# Patient Record
Sex: Male | Born: 1970 | Race: Black or African American | Hispanic: No | Marital: Married | Smoking: Current some day smoker
Health system: Southern US, Community
[De-identification: ages and names within clinical notes are randomized; demographics above are authoritative.]

## PROBLEM LIST (undated history)

## (undated) DIAGNOSIS — I10 Essential (primary) hypertension: Secondary | ICD-10-CM

## (undated) DIAGNOSIS — E119 Type 2 diabetes mellitus without complications: Secondary | ICD-10-CM

## (undated) DIAGNOSIS — G4730 Sleep apnea, unspecified: Secondary | ICD-10-CM

## (undated) DIAGNOSIS — J189 Pneumonia, unspecified organism: Secondary | ICD-10-CM

## (undated) DIAGNOSIS — E7800 Pure hypercholesterolemia, unspecified: Secondary | ICD-10-CM

## (undated) DIAGNOSIS — K219 Gastro-esophageal reflux disease without esophagitis: Secondary | ICD-10-CM

## (undated) HISTORY — DX: Type 2 diabetes mellitus without complications: E11.9

---

## 2004-08-20 ENCOUNTER — Ambulatory Visit (HOSPITAL_COMMUNITY): Admission: RE | Admit: 2004-08-20 | Discharge: 2004-08-20 | Payer: Self-pay | Admitting: Specialist

## 2007-01-05 ENCOUNTER — Ambulatory Visit: Payer: Self-pay | Admitting: *Deleted

## 2007-01-05 ENCOUNTER — Ambulatory Visit: Payer: Self-pay | Admitting: Internal Medicine

## 2007-01-05 DIAGNOSIS — J069 Acute upper respiratory infection, unspecified: Secondary | ICD-10-CM | POA: Insufficient documentation

## 2007-01-05 DIAGNOSIS — J020 Streptococcal pharyngitis: Secondary | ICD-10-CM | POA: Insufficient documentation

## 2007-01-05 LAB — CONVERTED CEMR LAB: Streptococcus, Group A Screen (Direct): POSITIVE — AB

## 2011-06-01 ENCOUNTER — Emergency Department (HOSPITAL_COMMUNITY): Payer: PRIVATE HEALTH INSURANCE

## 2011-06-01 ENCOUNTER — Encounter (HOSPITAL_COMMUNITY): Payer: Self-pay | Admitting: Radiology

## 2011-06-01 ENCOUNTER — Emergency Department (HOSPITAL_COMMUNITY)
Admission: EM | Admit: 2011-06-01 | Discharge: 2011-06-01 | Disposition: A | Discharge status: Home / Self Care | Payer: PRIVATE HEALTH INSURANCE | Attending: Emergency Medicine | Admitting: Emergency Medicine

## 2011-06-01 DIAGNOSIS — K219 Gastro-esophageal reflux disease without esophagitis: Secondary | ICD-10-CM | POA: Insufficient documentation

## 2011-06-01 DIAGNOSIS — R0602 Shortness of breath: Secondary | ICD-10-CM | POA: Insufficient documentation

## 2011-06-01 DIAGNOSIS — R079 Chest pain, unspecified: Secondary | ICD-10-CM | POA: Insufficient documentation

## 2011-06-01 DIAGNOSIS — R635 Abnormal weight gain: Secondary | ICD-10-CM | POA: Insufficient documentation

## 2011-06-01 DIAGNOSIS — R000 Tachycardia, unspecified: Secondary | ICD-10-CM | POA: Insufficient documentation

## 2011-06-01 DIAGNOSIS — I10 Essential (primary) hypertension: Secondary | ICD-10-CM | POA: Insufficient documentation

## 2011-06-01 HISTORY — DX: Gastro-esophageal reflux disease without esophagitis: K21.9

## 2011-06-01 LAB — COMPREHENSIVE METABOLIC PANEL
ALT: 48 U/L (ref 0–53)
AST: 25 U/L (ref 0–37)
Alkaline Phosphatase: 89 U/L (ref 39–117)
BUN: 12 mg/dL (ref 6–23)
CO2: 29 mEq/L (ref 19–32)
Calcium: 9.4 mg/dL (ref 8.4–10.5)
Chloride: 101 mEq/L (ref 96–112)
Creatinine, Ser: 1.15 mg/dL (ref 0.50–1.35)
GFR calc Af Amer: >60 mL/min (ref >60)
GFR calc non Af Amer: >60 mL/min (ref >60)
Glucose, Bld: 124 mg/dL — ABNORMAL HIGH (ref 70–99)
Potassium: 3.6 mEq/L (ref 3.5–5.1)
Total Bilirubin: 0.2 mg/dL — ABNORMAL LOW (ref 0.3–1.2)
Total Protein: 7.9 g/dL (ref 6.0–8.3)

## 2011-06-01 LAB — D-DIMER, QUANTITATIVE: D-Dimer, Quant: <0.22 ug/mL-FEU (ref 0.00–0.48)

## 2011-06-01 LAB — POCT I-STAT TROPONIN I: Troponin i, poc: 0 ng/mL (ref 0.00–0.08)

## 2011-06-01 LAB — CBC
HCT: 43.6 % (ref 39.0–52.0)
Hemoglobin: 15.1 g/dL (ref 13.0–17.0)
MCH: 27.2 pg (ref 26.0–34.0)
MCHC: 34.6 g/dL (ref 30.0–36.0)
RBC: 5.55 MIL/uL (ref 4.22–5.81)
RDW: 14.9 % (ref 11.5–15.5)
WBC: 8 10*3/uL (ref 4.0–10.5)

## 2011-06-01 MED ORDER — IOHEXOL 350 MG/ML SOLN
80.0000 mL | Freq: Once | INTRAVENOUS | Status: AC | PRN
  Administered 2011-06-01: 80 mL via INTRAVENOUS

## 2011-06-04 ENCOUNTER — Other Ambulatory Visit (INDEPENDENT_AMBULATORY_CARE_PROVIDER_SITE_OTHER): Payer: PRIVATE HEALTH INSURANCE | Admitting: *Deleted

## 2011-06-04 ENCOUNTER — Other Ambulatory Visit: Payer: Self-pay | Admitting: Internal Medicine

## 2011-06-04 ENCOUNTER — Other Ambulatory Visit: Payer: Self-pay | Admitting: *Deleted

## 2011-06-04 DIAGNOSIS — R079 Chest pain, unspecified: Secondary | ICD-10-CM

## 2011-06-04 DIAGNOSIS — I10 Essential (primary) hypertension: Secondary | ICD-10-CM

## 2011-06-04 LAB — LIPID PANEL
Total CHOL/HDL Ratio: 5
Triglycerides: 102 mg/dL (ref 0.0–149.0)
VLDL: 20.4 mg/dL (ref 0.0–40.0)

## 2011-06-04 LAB — LDL CHOLESTEROL, DIRECT: Direct LDL: 183 mg/dL

## 2011-06-04 LAB — HEMOGLOBIN A1C: Hgb A1c MFr Bld: 6.3 % (ref 4.6–6.5)

## 2011-06-05 LAB — C-REACTIVE PROTEIN: CRP: 0.89 mg/dL — ABNORMAL HIGH (ref <0.60)

## 2011-06-11 ENCOUNTER — Telehealth (HOSPITAL_COMMUNITY): Payer: Self-pay | Admitting: *Deleted

## 2011-06-11 MED ORDER — ATORVASTATIN CALCIUM 40 MG PO TABS: 40.0000 mg | ORAL_TABLET | Freq: Every day | ORAL | Status: DC

## 2011-06-11 NOTE — Telephone Encounter (Signed)
rx sent into pharmacy

## 2011-06-11 NOTE — Telephone Encounter (Signed)
Message copied by Noralee Space on Wed Jun 11, 2011  1:51 PM ------      Message from: Dolores Patty      Created: Tue Jun 10, 2011  2:40 PM       LDL markedly elevated. D/w patient re need for diet and exercise. Start atorva 20qd. (40mg  tabs - cut in half)

## 2011-06-18 NOTE — Consult Note (Signed)
NAMEMAHIN, GUARDIA NO.:  [id number]  MEDICAL RECORD NO.:  [id number]  LOCATION:  [facility]                         FACILITY:  [facility]  PHYSICIAN:  Konnar Ben. Bensimhon, MDDATE OF BIRTH:  May 16, 1971  DATE OF CONSULTATION:  06/01/2011 DATE OF DISCHARGE:                                CONSULTATION   REQUESTING PHYSICIAN:  [facility].  REASON FOR CONSULTATION:  Severe hypertension and chest pain.  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  He denies any history of known cardiac disease.  He also denies a history of hypertension or diabetes.  However, he has not had his blood pressure checked in over a year.  Yesterday, he had a brief episode of chest pain.  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  He found it hard to take a deep breath.  This lasted about 20 minutes.  He finally told one of his colleagues who felt that he was diaphoretic.  His chest pain then resolved spontaneously.  Another colleague came to see him, they checked his blood pressure and the systolic blood pressure was in the 160-170 range with diastolics around 100.  He was then driving home and was talking to his wife and decided to come back to the ER for further evaluation.  On getting to the ER, his blood pressure was 202/110 with a heart rate of 109.  EKG shows sinus tachycardia at 104 with incomplete right bundle- branch block and minimal ST elevation in V1 and V2.  His first set of cardiac markers were negative.  He was treated with three rounds of Lopressor, and his blood pressure is now 135/88, his heart rate is 68. His oxygen saturations are 100% on 2 liters.  REVIEW OF SYSTEMS:  He has a significant history of reflux disease.  He denies any fevers or  chills.  No nausea or vomiting.  His wife says he snores heavily.  He has not had any heart failure symptoms or palpitations.  Remainder of the review of systems, all systems are negative except for HPI and problem list.  PAST MEDICAL HISTORY: 1. Gastroesophageal reflux disease. 2. Tobacco use. 3. Obesity. 4. History of positive PPD status post treatment.  MEDICATIONS:  Prilosec.  ALLERGIES:  CHLOROQUINE.  SOCIAL HISTORY:  He is married.  He has three children.  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  He smokes about half pack a day of cigarettes which he has done for about 10 years.  He drinks alcohol only socially.  He denies any drug use.  FAMILY HISTORY:  His mother had multiple heart attacks in her 30s.  She actually died of complications from liver failure due to aflatoxin. Father had stroke and died in his 33s.  There are multiple family members on his mother's side with coronary artery disease and bypass surgery.  There is no known history of sudden cardiac death at young age.  PHYSICAL EXAMINATION:  GENERAL:  He is currently comfortable, respirations  are unlabored. VITAL SIGNS:  Blood pressure is now 135/85 and his heart rate is 75. HEENT:  Normal. NECK:  Supple and thick and hard to see JVD.  Carotids are 2+ bilaterally.  There is no lymphadenopathy or thyromegaly appreciated. CARDIAC:  PMI is nonpalpable.  He is regular with no obvious murmurs. LUNGS:  Clear. ABDOMEN:  Obese, nontender. EXTREMITIES:  Warm with no cyanosis, clubbing, or edema.  No rash. NEUROLOGIC:  He is alert and oriented x3.  Cranial nerves II through XII are grossly intact.  Moves all four extremities without difficulty.  White count is 8.0, hemoglobin is 15.1, platelets 216.  D-dimer is normal.  The troponin I is 0.  BMET is pending.  EKG shows sinus tachycardia with a rate of 104 with incomplete right bundle-branch block as described above.  There are no acute ST-T  wave abnormalities. Chest x-ray shows cardiomegaly but no airspace disease.  I have performed a bedside echo which showed left ventricular ejection fraction of 60-65%.  There are no regional wall motion abnormalities. The right ventricle looked normal.  ASSESSMENT: 1. Chest pain. 2. Hypertensive crisis. 3. Gastroesophageal reflux disease.  PLAN/DISCUSSION:  I suspect that his chest pain is likely due to hypertensive crisis.  We have treated him with three rounds of Lopressor with good effect.  Given his strong family history, we will proceed with a CT angiogram of his coronary arteries tonight.  I suspect we will be able to let him go home.  He will need tight control of his blood pressure.  I will start him on benazepril and Norvasc combination.  He will follow back up with me to check his lipid status as well as to get a possible sleep study.  I have stressed to him the need to stop smoking and also get into better shape and drop some weight.  We will start him on a baby aspirin as well.     Bevelyn Buckles. Bensimhon, MD     DRB/MEDQ  D:  06/01/2011  T:  06/01/2011  Job:  [id number]  Electronically Signed by Arvilla Meres MD on 06/18/2011 10:38:48 PM

## 2011-07-31 ENCOUNTER — Ambulatory Visit (HOSPITAL_COMMUNITY)
Admission: RE | Admit: 2011-07-31 | Discharge: 2011-07-31 | Disposition: A | Discharge status: Home / Self Care | Payer: PRIVATE HEALTH INSURANCE | Source: Ambulatory Visit | Attending: Internal Medicine | Admitting: Internal Medicine

## 2011-07-31 ENCOUNTER — Encounter (HOSPITAL_COMMUNITY): Payer: Self-pay

## 2011-07-31 VITALS — BP 146/86 | HR 80 | Ht 74.0 in | Wt 270.0 lb

## 2011-07-31 DIAGNOSIS — R0683 Snoring: Secondary | ICD-10-CM

## 2011-07-31 DIAGNOSIS — R079 Chest pain, unspecified: Secondary | ICD-10-CM

## 2011-07-31 DIAGNOSIS — R0989 Other specified symptoms and signs involving the circulatory and respiratory systems: Secondary | ICD-10-CM

## 2011-07-31 DIAGNOSIS — R0609 Other forms of dyspnea: Secondary | ICD-10-CM | POA: Insufficient documentation

## 2011-07-31 DIAGNOSIS — I10 Essential (primary) hypertension: Secondary | ICD-10-CM

## 2011-07-31 DIAGNOSIS — E785 Hyperlipidemia, unspecified: Secondary | ICD-10-CM

## 2011-07-31 LAB — COMPREHENSIVE METABOLIC PANEL
ALT: 66 U/L — ABNORMAL HIGH (ref 0–53)
AST: 37 U/L (ref 0–37)
Albumin: 4.2 g/dL (ref 3.5–5.2)
Alkaline Phosphatase: 118 U/L — ABNORMAL HIGH (ref 39–117)
BUN: 11 mg/dL (ref 6–23)
Calcium: 9.9 mg/dL (ref 8.4–10.5)
Chloride: 101 mEq/L (ref 96–112)
GFR calc non Af Amer: 88 mL/min — ABNORMAL LOW (ref >90)
Glucose, Bld: 103 mg/dL — ABNORMAL HIGH (ref 70–99)
Potassium: 3.6 mEq/L (ref 3.5–5.1)
Sodium: 142 mEq/L (ref 135–145)
Total Bilirubin: 0.4 mg/dL (ref 0.3–1.2)
Total Protein: 8 g/dL (ref 6.0–8.3)

## 2011-07-31 LAB — LIPID PANEL
Cholesterol: 169 mg/dL (ref 0–200)
HDL: 53 mg/dL (ref >39)
Total CHOL/HDL Ratio: 3.2 RATIO
Triglycerides: 84 mg/dL (ref <150)
VLDL: 17 mg/dL (ref 0–40)

## 2011-07-31 NOTE — Progress Notes (Signed)
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 Several months ago he had episode of CP and underwent cardiac CT which showed normal coronaries. Stated on Lotrel and atorvastatin.  Here for f/u.  Doing very well. Being more active. Walking several days a week on his weeks off for an hour at a time. Checking his BP occasionally SBP 130-140. No problem with medicines. Taking Lipitor 40 every other day. Changed his diet to lower fat foods and no salt but hasn't lost much weight.  Snores a lot. + daytime fatigue.   ROS: All systems negative except as listed in HPI, PMH and Problem List.  Past Medical History  Diagnosis Date  . GERD (gastroesophageal reflux disease)     Current Outpatient Prescriptions  Medication Sig Dispense Refill  . amLODipine-benazepril (LOTREL) 10-20 MG per capsule Take 1 capsule by mouth daily.        Marland Kitchen atorvastatin (LIPITOR) 40 MG tablet Take 20 mg by mouth daily.        Marland Kitchen omeprazole (PRILOSEC) 20 MG capsule Take 40 mg by mouth daily.           PHYSICAL EXAM: Filed Vitals:   07/31/11 0942  BP: 146/86  Pulse: 80   Weight change:  General:  Well appearing. No resp difficulty HEENT: normal Neck: supple. JVP flat. Carotids 2+ bilaterally; no bruits. No lymphadenopathy or thryomegaly appreciated. Cor: PMI normal. Regular rate & rhythm. No rubs, gallops or murmurs. Lungs: clear Abdomen: soft, obese, nontender, nondistended. No hepatosplenomegaly.  Extremities: no cyanosis, clubbing, rash, edema Neuro: alert & orientedx3, cranial nerves grossly intact. Moves all 4 extremities w/o difficulty. Affect pleasant.    ECG:   ASSESSMENT & PLAN:

## 2011-08-01 DIAGNOSIS — E785 Hyperlipidemia, unspecified: Secondary | ICD-10-CM | POA: Insufficient documentation

## 2011-08-01 DIAGNOSIS — R0683 Snoring: Secondary | ICD-10-CM | POA: Insufficient documentation

## 2011-08-01 DIAGNOSIS — I10 Essential (primary) hypertension: Secondary | ICD-10-CM | POA: Insufficient documentation

## 2011-08-01 DIAGNOSIS — R079 Chest pain, unspecified: Secondary | ICD-10-CM | POA: Insufficient documentation

## 2011-08-01 NOTE — Assessment & Plan Note (Addendum)
Continue statin. Due for lipids recheck. Continue exercise and diet modification.

## 2011-08-01 NOTE — Assessment & Plan Note (Signed)
Resolved. Cardiac CT without any CAD. Needs aggressive RF management.

## 2011-08-01 NOTE — Assessment & Plan Note (Signed)
He almost certainly has OSA. Discussed going for sleep study. Would like a chance to lose weight and treat with weight loss rather than CPAP. Will give him 3-4 month trial to see how he does. If weight still up on return get sleep study.

## 2011-08-01 NOTE — Assessment & Plan Note (Signed)
BP improved but still elevated. Increase Lotrel 40/20.

## 2011-12-04 ENCOUNTER — Encounter (HOSPITAL_COMMUNITY): Payer: Self-pay

## 2011-12-04 ENCOUNTER — Ambulatory Visit (HOSPITAL_COMMUNITY)
Admission: RE | Admit: 2011-12-04 | Discharge: 2011-12-04 | Disposition: A | Discharge status: Home / Self Care | Payer: PRIVATE HEALTH INSURANCE | Source: Ambulatory Visit | Attending: Internal Medicine | Admitting: Internal Medicine

## 2011-12-04 ENCOUNTER — Encounter (HOSPITAL_COMMUNITY): Payer: PRIVATE HEALTH INSURANCE

## 2011-12-04 VITALS — BP 120/76 | HR 85 | Wt 260.5 lb

## 2011-12-04 DIAGNOSIS — I10 Essential (primary) hypertension: Secondary | ICD-10-CM | POA: Insufficient documentation

## 2011-12-04 DIAGNOSIS — R0683 Snoring: Secondary | ICD-10-CM

## 2011-12-04 DIAGNOSIS — R0609 Other forms of dyspnea: Secondary | ICD-10-CM | POA: Insufficient documentation

## 2011-12-04 DIAGNOSIS — E669 Obesity, unspecified: Secondary | ICD-10-CM | POA: Insufficient documentation

## 2011-12-04 DIAGNOSIS — K219 Gastro-esophageal reflux disease without esophagitis: Secondary | ICD-10-CM | POA: Insufficient documentation

## 2011-12-04 DIAGNOSIS — R0989 Other specified symptoms and signs involving the circulatory and respiratory systems: Secondary | ICD-10-CM | POA: Insufficient documentation

## 2011-12-04 DIAGNOSIS — F17200 Nicotine dependence, unspecified, uncomplicated: Secondary | ICD-10-CM

## 2011-12-04 DIAGNOSIS — E785 Hyperlipidemia, unspecified: Secondary | ICD-10-CM

## 2011-12-04 DIAGNOSIS — Z79899 Other long term (current) drug therapy: Secondary | ICD-10-CM | POA: Insufficient documentation

## 2011-12-04 DIAGNOSIS — Z720 Tobacco use: Secondary | ICD-10-CM

## 2011-12-04 NOTE — Progress Notes (Signed)
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 Several months ago he had episode of CP and underwent cardiac CT which showed normal coronaries. Stated on Lotrel and atorvastatin.  Here for f/u.  Doing very well. Being more active. Walking several days a week on his weeks off for an hour at a time. Checking his BP occasionally SBP 120-135. No problem with medicines. Taking Lipitor 40 every other day. Changed his diet to lower fat foods and lost about 10 pounds.  Still smoking a couple cigarettes here and there.   Lipids rechecked in 11/12:  (LDL down from 183)  Lab Results  Component Value Date   CHOL 169 07/31/2011   HDL 53 07/31/2011   LDLCALC 99 07/31/2011   LDLDIRECT 183.0 06/04/2011   TRIG 84 07/31/2011   CHOLHDL 3.2 07/31/2011      Snores a lot. + daytime fatigue.   ROS: All systems negative except as listed in HPI, PMH and Problem List.  Past Medical History  Diagnosis Date  . GERD (gastroesophageal reflux disease)     Current Outpatient Prescriptions  Medication Sig Dispense Refill  . amLODipine-benazepril (LOTREL) 10-40 MG per capsule Take 1 capsule by mouth daily.      Marland Kitchen atorvastatin (LIPITOR) 40 MG tablet Take 20 mg by mouth daily.        Marland Kitchen omeprazole (PRILOSEC) 20 MG capsule Take 40 mg by mouth daily.           PHYSICAL EXAM: Filed Vitals:   12/04/11 1051  BP: 120/76  Pulse: 85   Weight change:  General:  Well appearing. No resp difficulty HEENT: normal Neck: supple. JVP flat. Carotids 2+ bilaterally; no bruits. No lymphadenopathy or thryomegaly appreciated. Cor: PMI normal. Regular rate & rhythm. No rubs, gallops or murmurs. Lungs: clear Abdomen: soft, obese, nontender, nondistended. No hepatosplenomegaly.  Extremities: no cyanosis, clubbing, rash, edema Neuro: alert & orientedx3, cranial nerves grossly intact. Moves all 4 extremities w/o difficulty. Affect pleasant.    ASSESSMENT & PLAN:

## 2011-12-04 NOTE — Patient Instructions (Signed)
Cmet, lipids and hgb A1C in 2 months.  We will contact you in 6 months to schedule your next appointment.

## 2011-12-07 DIAGNOSIS — Z720 Tobacco use: Secondary | ICD-10-CM | POA: Insufficient documentation

## 2011-12-07 NOTE — Assessment & Plan Note (Addendum)
Encouraged him to stop smoking completely.

## 2011-12-07 NOTE — Assessment & Plan Note (Signed)
Much improved with atorva. Will continue.

## 2011-12-07 NOTE — Assessment & Plan Note (Signed)
Much improved. Continue current regimen. Encouraged him to continue with his diet and exercise and weight loss efforts.

## 2011-12-07 NOTE — Assessment & Plan Note (Signed)
He would like to defer sleep study at this time and continue to try to improve with weight loss. As he does not have significant fatigue and bP is well controlled, I think this is reasonable.

## 2011-12-18 ENCOUNTER — Encounter (HOSPITAL_COMMUNITY): Payer: PRIVATE HEALTH INSURANCE

## 2012-07-20 ENCOUNTER — Other Ambulatory Visit (HOSPITAL_COMMUNITY): Payer: Self-pay | Admitting: *Deleted

## 2012-07-20 MED ORDER — ATORVASTATIN CALCIUM 40 MG PO TABS: 20.0000 mg | ORAL_TABLET | Freq: Every day | ORAL | Status: DC

## 2012-12-02 ENCOUNTER — Other Ambulatory Visit (HOSPITAL_COMMUNITY): Payer: Self-pay | Admitting: *Deleted

## 2012-12-02 MED ORDER — ATORVASTATIN CALCIUM 40 MG PO TABS: 20.0000 mg | ORAL_TABLET | Freq: Every day | ORAL | Status: DC

## 2012-12-02 MED ORDER — AMLODIPINE BESY-BENAZEPRIL HCL 10-40 MG PO CAPS: 1.0000 | ORAL_CAPSULE | Freq: Every day | ORAL | Status: DC

## 2013-01-03 ENCOUNTER — Telehealth (HOSPITAL_COMMUNITY): Payer: Self-pay | Admitting: *Deleted

## 2013-01-03 DIAGNOSIS — I10 Essential (primary) hypertension: Secondary | ICD-10-CM

## 2013-01-03 MED ORDER — POTASSIUM CHLORIDE CRYS ER 20 MEQ PO TBCR: 20.0000 meq | EXTENDED_RELEASE_TABLET | Freq: Every day | ORAL | Status: DC

## 2013-01-03 NOTE — Telephone Encounter (Signed)
Per Dr Gala Romney pt needs KCL 20 meq daily, pt aware and prescription sent into pharmacy, he will get bmet next week at [facility]

## 2013-01-12 ENCOUNTER — Other Ambulatory Visit (INDEPENDENT_AMBULATORY_CARE_PROVIDER_SITE_OTHER): Payer: PRIVATE HEALTH INSURANCE

## 2013-01-12 DIAGNOSIS — I10 Essential (primary) hypertension: Secondary | ICD-10-CM

## 2013-01-12 LAB — BASIC METABOLIC PANEL
BUN: 14 mg/dL (ref 6–23)
CO2: 28 mEq/L (ref 19–32)
Calcium: 9.1 mg/dL (ref 8.4–10.5)
GFR: 99.19 mL/min (ref >60.00)
Potassium: 4 mEq/L (ref 3.5–5.1)
Sodium: 139 mEq/L (ref 135–145)

## 2013-01-25 ENCOUNTER — Encounter (HOSPITAL_COMMUNITY): Payer: Self-pay | Admitting: *Deleted

## 2013-06-22 ENCOUNTER — Telehealth (HOSPITAL_COMMUNITY): Payer: Self-pay | Admitting: *Deleted

## 2013-06-22 MED ORDER — ROSUVASTATIN CALCIUM 10 MG PO TABS: 10.0000 mg | ORAL_TABLET | Freq: Every day | ORAL | Status: DC

## 2013-06-22 NOTE — Telephone Encounter (Signed)
Pt called and c/o myalgia with Lipitor, per Dr Gala Romney change to Crestor 10 mg daily pt aware, rx sent in pt will call back to sch appt and labs

## 2013-11-25 ENCOUNTER — Other Ambulatory Visit (HOSPITAL_COMMUNITY): Payer: Self-pay | Admitting: *Deleted

## 2013-11-25 MED ORDER — AMLODIPINE BESY-BENAZEPRIL HCL 10-40 MG PO CAPS: 1.0000 | ORAL_CAPSULE | Freq: Every day | ORAL | Status: DC

## 2015-03-02 ENCOUNTER — Other Ambulatory Visit (HOSPITAL_COMMUNITY): Payer: Self-pay | Admitting: *Deleted

## 2015-03-02 MED ORDER — AMLODIPINE BESY-BENAZEPRIL HCL 10-40 MG PO CAPS: 1.0000 | ORAL_CAPSULE | Freq: Every day | ORAL | Status: DC

## 2015-03-02 MED ORDER — ROSUVASTATIN CALCIUM 10 MG PO TABS: 10.0000 mg | ORAL_TABLET | Freq: Every day | ORAL | Status: DC

## 2015-04-18 ENCOUNTER — Encounter (HOSPITAL_COMMUNITY): Payer: Self-pay

## 2015-04-18 ENCOUNTER — Ambulatory Visit (HOSPITAL_COMMUNITY)
Admission: RE | Admit: 2015-04-18 | Discharge: 2015-04-18 | Disposition: A | Discharge status: Home / Self Care | Payer: PRIVATE HEALTH INSURANCE | Source: Ambulatory Visit | Attending: Internal Medicine | Admitting: Internal Medicine

## 2015-04-18 VITALS — BP 132/84 | HR 87 | Wt 281.5 lb

## 2015-04-18 DIAGNOSIS — R0683 Snoring: Secondary | ICD-10-CM

## 2015-04-18 DIAGNOSIS — I10 Essential (primary) hypertension: Secondary | ICD-10-CM | POA: Insufficient documentation

## 2015-04-18 DIAGNOSIS — Z720 Tobacco use: Secondary | ICD-10-CM | POA: Insufficient documentation

## 2015-04-18 DIAGNOSIS — E785 Hyperlipidemia, unspecified: Secondary | ICD-10-CM | POA: Insufficient documentation

## 2015-04-18 DIAGNOSIS — E669 Obesity, unspecified: Secondary | ICD-10-CM | POA: Diagnosis not present

## 2015-04-18 LAB — LIPID PANEL
CHOL/HDL RATIO: 4.5 ratio
CHOLESTEROL: 212 mg/dL — AB (ref 0–200)
HDL: 47 mg/dL (ref >40)
LDL CALC: 150 mg/dL — AB (ref 0–99)
TRIGLYCERIDES: 76 mg/dL (ref <150)
VLDL: 15 mg/dL (ref 0–40)

## 2015-04-18 LAB — COMPREHENSIVE METABOLIC PANEL
ALT: 44 U/L (ref 17–63)
ANION GAP: 9 (ref 5–15)
AST: 26 U/L (ref 15–41)
Albumin: 4.1 g/dL (ref 3.5–5.0)
Alkaline Phosphatase: 76 U/L (ref 38–126)
BILIRUBIN TOTAL: 0.7 mg/dL (ref 0.3–1.2)
BUN: 10 mg/dL (ref 6–20)
CALCIUM: 9.3 mg/dL (ref 8.9–10.3)
CO2: 26 mmol/L (ref 22–32)
Chloride: 103 mmol/L (ref 101–111)
Creatinine, Ser: 1.03 mg/dL (ref 0.61–1.24)
GFR calc Af Amer: >60 mL/min (ref >60)
GFR calc non Af Amer: >60 mL/min (ref >60)
GLUCOSE: 99 mg/dL (ref 65–99)
POTASSIUM: 3.7 mmol/L (ref 3.5–5.1)
Sodium: 138 mmol/L (ref 135–145)
Total Protein: 7.8 g/dL (ref 6.5–8.1)

## 2015-04-18 LAB — TSH: TSH: 0.711 u[IU]/mL (ref 0.350–4.500)

## 2015-04-18 LAB — CBC
HCT: 46.7 % (ref 39.0–52.0)
HEMOGLOBIN: 16 g/dL (ref 13.0–17.0)
MCH: 26.8 pg (ref 26.0–34.0)
MCHC: 34.3 g/dL (ref 30.0–36.0)
MCV: 78.1 fL (ref 78.0–100.0)
Platelets: 202 10*3/uL (ref 150–400)
RBC: 5.98 MIL/uL — ABNORMAL HIGH (ref 4.22–5.81)
RDW: 14.7 % (ref 11.5–15.5)
WBC: 5.6 10*3/uL (ref 4.0–10.5)

## 2015-04-18 NOTE — Addendum Note (Signed)
Encounter addended by: Effie Berkshire, RN on: 04/18/2015 11:00 AM<BR>     Documentation filed: Orders, Dx Association, Patient Instructions Section

## 2015-04-18 NOTE — Progress Notes (Signed)
Patient ID: Mark Nichols, male   DOB: 12/11/1970, 44 y.o.   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 Underwent cardiac CT which showed normal coronaries in 9/12. Stated on Lotrel and atorvastatin. Could not tolerate atorva due to myalgias. Now on crestor 20 qod.   Here for f/u.  Overall doing well. Continues to struggle with severe myalgias. SBP stable in 130s. Compliant with med. Has cut back on smoking now 1/2 ppd. Snores a lot. Has never had a sleep study. Walking for exercise on occasion. Has gained 20 pounds.   Lipids rechecked in 11/12:  (LDL down from 183)  Lab Results  Component Value Date   CHOL 169 07/31/2011   HDL 53 07/31/2011   LDLCALC 99 07/31/2011   LDLDIRECT 183.0 06/04/2011   TRIG 84 07/31/2011   CHOLHDL 3.2 07/31/2011    ROS: All systems negative except as listed in HPI, PMH and Problem List.  Past Medical History  Diagnosis Date  . GERD (gastroesophageal reflux disease)     Current Outpatient Prescriptions  Medication Sig Dispense Refill  . amLODipine-benazepril (LOTREL) 10-40 MG per capsule Take 1 capsule by mouth daily. 90 capsule 4  . omeprazole (PRILOSEC) 20 MG capsule Take 40 mg by mouth daily.      . rosuvastatin (CRESTOR) 10 MG tablet Take 1 tablet (10 mg total) by mouth daily. 90 tablet 3   No current facility-administered medications for this encounter.     PHYSICAL EXAM: Filed Vitals:   04/18/15 1018  BP: 132/84  Pulse: 87   General:  Well appearing. No resp difficulty HEENT: normal Neck: supple. JVP flat. Carotids 2+ bilaterally; no bruits. No lymphadenopathy or thryomegaly appreciated. Cor: PMI normal. Regular rate & rhythm. No rubs, gallops or murmurs. Lungs: clear Abdomen: soft, obese, nontender, nondistended. No hepatosplenomegaly.  Extremities: no cyanosis, clubbing, rash, edema Neuro: alert & orientedx3, cranial nerves grossly intact. Moves all 4 extremities w/o  difficulty. Affect pleasant.  NSR 66 No ST-T wave abnormalities.    ASSESSMENT & PLAN: 1. HTN 2. Hyperlipidemia  --c/b statin induced myalgias 3. Probable OSA 4. Tobacco use 5. Obesity   Will continue current medications for now. Wil try coQ10 200mg  daily to see if it will help with myalgias. If not can consider switch to pravastatin +/- zetia. Will refer for sleep study. Ideally would like SBP < 130. Hopefull will improve with treatment of OSA. Needs to stop smoking and lose weight. Check labs today including: CBC, CMET, lipid panel, hgba1c, TSH.   Bensimhon, Kalub,MD 10:54 AM

## 2015-04-18 NOTE — Patient Instructions (Signed)
Routine lab work today. Will notify you of abnormal results, otherwise no news is good news!  Follow up 12 months.  Do the following things EVERYDAY: 1) Weigh yourself in the morning before breakfast. Write it down and keep it in a log. 2) Take your medicines as prescribed 3) Eat low salt foods-Limit salt (sodium) to 2000 mg per day.  4) Stay as active as you can everyday 5) Limit all fluids for the day to less than 2 liters

## 2015-04-25 ENCOUNTER — Other Ambulatory Visit (HOSPITAL_COMMUNITY): Payer: Self-pay | Admitting: *Deleted

## 2015-04-25 MED ORDER — EZETIMIBE 10 MG PO TABS: 10.0000 mg | ORAL_TABLET | Freq: Every day | ORAL | Status: DC

## 2015-04-25 MED ORDER — OMEPRAZOLE 20 MG PO CPDR: 40.0000 mg | DELAYED_RELEASE_CAPSULE | Freq: Every day | ORAL | Status: DC

## 2015-05-14 ENCOUNTER — Telehealth: Payer: Self-pay | Admitting: Pulmonary Disease

## 2015-05-14 DIAGNOSIS — G4733 Obstructive sleep apnea (adult) (pediatric): Secondary | ICD-10-CM

## 2015-05-14 NOTE — Telephone Encounter (Signed)
Order entered for HST Patient notified. Will schedule appointment when resulted. Nothing further needed.

## 2015-05-14 NOTE — Telephone Encounter (Signed)
Request from dr bensimhon - pl order home sleep test Make appt for [facility] office after study to discuss results

## 2016-02-21 DIAGNOSIS — B0052 Herpesviral keratitis: Secondary | ICD-10-CM | POA: Diagnosis not present

## 2016-05-27 ENCOUNTER — Other Ambulatory Visit (HOSPITAL_COMMUNITY): Payer: Self-pay | Admitting: *Deleted

## 2016-05-27 MED ORDER — EZETIMIBE 10 MG PO TABS: 10.0000 mg | ORAL_TABLET | Freq: Every day | ORAL | 3 refills | Status: DC

## 2016-05-27 MED ORDER — AMLODIPINE BESY-BENAZEPRIL HCL 10-40 MG PO CAPS: 1.0000 | ORAL_CAPSULE | Freq: Every day | ORAL | 3 refills | Status: DC

## 2016-05-27 MED ORDER — OMEPRAZOLE 20 MG PO CPDR: 40.0000 mg | DELAYED_RELEASE_CAPSULE | Freq: Every day | ORAL | 3 refills | Status: DC

## 2016-05-27 MED ORDER — ROSUVASTATIN CALCIUM 10 MG PO TABS: 10.0000 mg | ORAL_TABLET | Freq: Every day | ORAL | 3 refills | Status: DC

## 2016-07-02 MED FILL — VENTOLIN HFA 90 MCG INHALER: 108 (90 BAS | 25 days supply | Qty: 18 | Fill #0

## 2016-07-02 MED FILL — AZITHROMYCIN 500 MG TABLET: 500 | 5 days supply | Qty: 5 | Fill #0

## 2016-10-27 MED FILL — OSELTAMIVIR PHOS 75 MG CAP: 75 | 5 days supply | Qty: 10 | Fill #0

## 2017-05-26 ENCOUNTER — Other Ambulatory Visit (HOSPITAL_COMMUNITY): Payer: Self-pay | Admitting: *Deleted

## 2017-05-26 MED ORDER — AMLODIPINE BESY-BENAZEPRIL HCL 10-40 MG PO CAPS: 1.0000 | ORAL_CAPSULE | Freq: Every day | ORAL | 3 refills | Status: DC

## 2017-05-26 MED ORDER — OMEPRAZOLE 20 MG PO CPDR: 40.0000 mg | DELAYED_RELEASE_CAPSULE | Freq: Every day | ORAL | 0 refills | Status: DC

## 2017-05-26 MED FILL — AMLODIPINE-BENAZEPRIL 10-40: 10-40 | 90 days supply | Qty: 90 | Fill #0

## 2017-05-26 MED FILL — OMEPRAZOLE 20 MG CAP: 20 | 90 days supply | Qty: 180 | Fill #0

## 2017-08-06 DIAGNOSIS — H11823 Conjunctivochalasis, bilateral: Secondary | ICD-10-CM | POA: Diagnosis not present

## 2017-08-06 DIAGNOSIS — Z8619 Personal history of other infectious and parasitic diseases: Secondary | ICD-10-CM | POA: Diagnosis not present

## 2017-08-06 DIAGNOSIS — H11153 Pinguecula, bilateral: Secondary | ICD-10-CM | POA: Diagnosis not present

## 2017-08-06 DIAGNOSIS — H2513 Age-related nuclear cataract, bilateral: Secondary | ICD-10-CM | POA: Diagnosis not present

## 2017-08-18 ENCOUNTER — Other Ambulatory Visit (HOSPITAL_COMMUNITY): Payer: Self-pay | Admitting: Internal Medicine

## 2017-08-18 MED FILL — AMLODIPINE-BENAZ 10-40 MG: 10-40 | 90 days supply | Qty: 90 | Fill #1

## 2017-11-10 MED FILL — AMLODIPINE-BENAZ 10-40 MG: 10-40 | 90 days supply | Qty: 90 | Fill #2

## 2017-11-26 ENCOUNTER — Encounter: Payer: Self-pay | Admitting: Pulmonary Disease

## 2017-11-26 ENCOUNTER — Ambulatory Visit: Payer: 59 | Admitting: Pulmonary Disease

## 2017-11-26 DIAGNOSIS — G4733 Obstructive sleep apnea (adult) (pediatric): Secondary | ICD-10-CM | POA: Diagnosis not present

## 2017-11-26 NOTE — Patient Instructions (Signed)
Home sleep study 

## 2017-11-26 NOTE — Assessment & Plan Note (Signed)
Given excessive daytime somnolence, narrow pharyngeal exam, witnessed apneas & loud snoring, obstructive sleep apnea is very likely & an overnight polysomnogram will be scheduled as a home study. The pathophysiology of obstructive sleep apnea , it's cardiovascular consequences & modes of treatment including CPAP were discused with the patient in detail & they evidenced understanding.  Pretest probability is high 

## 2017-11-26 NOTE — Assessment & Plan Note (Signed)
Weight loss recommended, at least 10% of body weight about 30 pounds for him over the next year

## 2017-11-26 NOTE — Progress Notes (Signed)
Subjective:    Patient ID: Mark Nichols, male    DOB: Aug 15, 1971, 47 y.o.   MRN: [id number]  HPI  Chief Complaint  Patient presents with  . Sleep Consult    Referral for eval for OSA. Denies ever having a sleeps study before.      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 Loud snoring has been witnessed by family members and his wife is also witnessed apneas. Epworth sleepiness score is 20 and he reports sleepiness while sitting and reading, watching TV, sitting inactive in a public place or as a passenger in a car.  He sometimes has to drive back and forth from [facility] to [place] and does admit to falling sleepy sometimes.  He had an episode of hypertensive urgency seeing a cardiologist. His neck circumference is 19.5 inches and his weight is about 291 pounds  Bedtime is between 11 PM to 1 AM, his work schedule is only 1 week on the next week off, sleep latency is a few minutes, he reports significant acid reflux for which she sometimes sleeps on 3 pillows, prefers to sleep on his side, reports several nocturnal awakenings spontaneously with some nocturia and is out of bed by 6 AM on workdays and around 645 on his days off feeling tired with occasional dryness of mouth.  He denies morning headaches.  He has gained about 70-80 pounds in the last 10 years.  There is no history suggestive of cataplexy, sleep paralysis or parasomnias   Past medical history of hypertension hypercholesterolemia Medications were reviewed.      Past Medical History:  Diagnosis Date  . GERD (gastroesophageal reflux disease)    Allergies  Allergen Reactions  . Chloroquine     Social History   Socioeconomic History  . Marital status: Married    Spouse name: Not on file  . Number of children: Not on file  . Years of education: Not on file  . Highest education level: Not on file  Social Needs  . Financial resource strain:  Not on file  . Food insecurity - worry: Not on file  . Food insecurity - inability: Not on file  . Transportation needs - medical: Not on file  . Transportation needs - non-medical: Not on file  Occupational History  . Not on file  Tobacco Use  . Smoking status: Current Some Day Smoker  . Smokeless tobacco: Never Used  Substance and Sexual Activity  . Alcohol use: Yes    Comment: occasionally  . Drug use: No  . Sexual activity: Not on file  Other Topics Concern  . Not on file  Social History Narrative  . Not on file      History reviewed. No pertinent family history.    Review of Systems  Positive for sleepiness as above, acid heartburn and nasal congestion  Constitutional: negative for anorexia, fevers and sweats  Eyes: negative for irritation, redness and visual disturbance  Ears, nose, mouth, throat, and face: negative for earaches, epistaxis, nasal congestion and sore throat  Respiratory: negative for cough, dyspnea on exertion, sputum and wheezing  Cardiovascular: negative for chest pain, dyspnea, lower extremity edema, orthopnea, palpitations and syncope  Gastrointestinal: negative for abdominal pain, constipation, diarrhea, melena, nausea and vomiting  Genitourinary:negative for dysuria, frequency and hematuria  Hematologic/lymphatic: negative for bleeding, easy bruising and lymphadenopathy  Musculoskeletal:negative for arthralgias, muscle weakness and stiff joints  Neurological: negative for coordination problems, gait problems, headaches and weakness  Endocrine: negative for diabetic symptoms including polydipsia, polyuria and weight loss     Objective:   Physical Exam  Gen. Pleasant, obese, in no distress, normal affect ENT - large uvula, no post nasal drip, class 3 airway , swollen nasal turbinates Neck: No JVD, no thyromegaly, no carotid bruits, circ 19.5 " Lungs: no use of accessory muscles, no dullness to percussion, decreased without rales or rhonchi    Cardiovascular: Rhythm regular, heart sounds  normal, no murmurs or gallops, no peripheral edema Abdomen: soft and non-tender, no hepatosplenomegaly, BS normal. Musculoskeletal: No deformities, no cyanosis or clubbing Neuro:  alert, non focal, no tremors       Assessment & Plan:

## 2017-11-30 DIAGNOSIS — G4733 Obstructive sleep apnea (adult) (pediatric): Secondary | ICD-10-CM | POA: Diagnosis not present

## 2017-12-01 ENCOUNTER — Telehealth: Payer: Self-pay | Admitting: Pulmonary Disease

## 2017-12-01 DIAGNOSIS — G4733 Obstructive sleep apnea (adult) (pediatric): Secondary | ICD-10-CM

## 2017-12-01 NOTE — Telephone Encounter (Signed)
Per RA, HST showed severe OSA. Recommends a RX for auto cpap 5-15cm, mask of choice, humidity and OV in 6 weeks.   RA spoke with patient personally, patient wishes to proceed with CPAP order. Will go ahead and place this today.   Nothing else needed at time of call.

## 2017-12-02 ENCOUNTER — Other Ambulatory Visit: Payer: Self-pay | Admitting: *Deleted

## 2017-12-02 DIAGNOSIS — G4733 Obstructive sleep apnea (adult) (pediatric): Secondary | ICD-10-CM

## 2017-12-29 DIAGNOSIS — G4733 Obstructive sleep apnea (adult) (pediatric): Secondary | ICD-10-CM | POA: Diagnosis not present

## 2018-01-28 DIAGNOSIS — G4733 Obstructive sleep apnea (adult) (pediatric): Secondary | ICD-10-CM | POA: Diagnosis not present

## 2018-02-04 ENCOUNTER — Telehealth: Payer: Self-pay | Admitting: Pulmonary Disease

## 2018-02-04 NOTE — Telephone Encounter (Signed)
Spoke with pt, he called to make an appt with RA. Made appt on 02/09/2018 at 10:00am. He states he received his CPAP around 12/28/2017. Nothing further is needed.

## 2018-02-09 ENCOUNTER — Encounter: Payer: Self-pay | Admitting: Pulmonary Disease

## 2018-02-09 ENCOUNTER — Ambulatory Visit: Payer: 59 | Admitting: Pulmonary Disease

## 2018-02-09 DIAGNOSIS — G4733 Obstructive sleep apnea (adult) (pediatric): Secondary | ICD-10-CM | POA: Diagnosis not present

## 2018-02-09 MED FILL — AMLODIPINE-BENAZ 10-40 MG: 10-40 | 90 days supply | Qty: 90 | Fill #3

## 2018-02-09 NOTE — Assessment & Plan Note (Signed)
Seems to require average of 14 cm. Auto settings seem to be working well for him. He has settled down with nasal mask and this seems to work well. He needs to extend his sleep time about 6 hours every night  Weight loss encouraged, compliance with goal of at least 4-6 hrs every night is the expectation. Advised against medications with sedative side effects Cautioned against driving when sleepy - understanding that sleepiness will vary on a day to day basis

## 2018-02-09 NOTE — Progress Notes (Signed)
   Subjective:    Patient ID: Mark Nichols, male    DOB: Sep 21, 1971, 47 y.o.   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  He slept on his left side for about an hour and this also showed severe OSA. He was started on auto CPAP therapy, he is settled on nasal mask, he tried nasal pillows initially. He noticed improvement in his daytime somnolence fatigue, life as noted that snoring is been abolished. Download was reviewed which shows excellent control of events, AHI about 3/hour on average pressure of 14 cm with minimal leak. Compliance is acceptable more than 4.5 hours on average, with few nights less than 4 hours. His sleep pattern is erratic especially in the weeks that he works and he gets about 4 hours of sleep on his work nights  No problems with mask or pressure, no problems with dryness  Significant tests/ events reviewed  HST 11/2017  Severe, AHI 77/h  Review of Systems Patient denies significant dyspnea,cough, hemoptysis,  chest pain, palpitations, pedal edema, orthopnea, paroxysmal nocturnal dyspnea, lightheadedness, nausea, vomiting, abdominal or  leg pains      Objective:   Physical Exam  Gen. Pleasant, obese, in no distress ENT - no lesions, no post nasal drip Neck: No JVD, no thyromegaly, no carotid bruits Lungs: no use of accessory muscles, no dullness to percussion, decreased without rales or rhonchi  Cardiovascular: Rhythm regular, heart sounds  normal, no murmurs or gallops, no peripheral edema Musculoskeletal: No deformities, no cyanosis or clubbing , no tremors       Assessment & Plan:

## 2018-02-09 NOTE — Patient Instructions (Signed)
You are on auto CPAP settings with average pressure of 14 cm. CPAP is working well. Try to get at least 6 hours of sleep every night  CPAP supplies will be renewed for a year

## 2018-02-28 DIAGNOSIS — G4733 Obstructive sleep apnea (adult) (pediatric): Secondary | ICD-10-CM | POA: Diagnosis not present

## 2018-03-02 DIAGNOSIS — G4733 Obstructive sleep apnea (adult) (pediatric): Secondary | ICD-10-CM | POA: Diagnosis not present

## 2018-03-09 DIAGNOSIS — B0052 Herpesviral keratitis: Secondary | ICD-10-CM | POA: Diagnosis not present

## 2018-03-30 DIAGNOSIS — G4733 Obstructive sleep apnea (adult) (pediatric): Secondary | ICD-10-CM | POA: Diagnosis not present

## 2018-04-05 DIAGNOSIS — G4733 Obstructive sleep apnea (adult) (pediatric): Secondary | ICD-10-CM | POA: Diagnosis not present

## 2018-04-16 ENCOUNTER — Telehealth: Payer: Self-pay | Admitting: Pulmonary Disease

## 2018-04-16 NOTE — Telephone Encounter (Signed)
Called [facility] and spoke with Manuela Schwartz. She is needing OV notes from visit on 02/09/18. Verified faxed number and sent notes over. Nothing further needed.

## 2018-04-30 DIAGNOSIS — G4733 Obstructive sleep apnea (adult) (pediatric): Secondary | ICD-10-CM | POA: Diagnosis not present

## 2018-05-06 ENCOUNTER — Other Ambulatory Visit (HOSPITAL_COMMUNITY): Payer: Self-pay | Admitting: *Deleted

## 2018-05-06 MED ORDER — AMLODIPINE BESY-BENAZEPRIL HCL 10-40 MG PO CAPS: 1.0000 | ORAL_CAPSULE | Freq: Every day | ORAL | 6 refills | Status: DC

## 2018-05-06 MED ORDER — AMLODIPINE BESY-BENAZEPRIL HCL 10-40 MG PO CAPS: 1.0000 | ORAL_CAPSULE | Freq: Every day | ORAL | 2 refills | Status: DC

## 2018-05-06 MED ORDER — OMEPRAZOLE MAGNESIUM 20 MG PO TBEC: 20.0000 mg | DELAYED_RELEASE_TABLET | Freq: Every day | ORAL | 6 refills | Status: DC

## 2018-05-06 MED ORDER — OMEPRAZOLE MAGNESIUM 20 MG PO TBEC: 20.0000 mg | DELAYED_RELEASE_TABLET | Freq: Every day | ORAL | 2 refills | Status: DC

## 2018-05-06 MED ORDER — ROSUVASTATIN CALCIUM 10 MG PO TABS: 10.0000 mg | ORAL_TABLET | Freq: Every day | ORAL | 6 refills | Status: DC

## 2018-05-06 MED FILL — OMEPRAZOLE 20 MG CPDR: 20 | 30 days supply | Qty: 30 | Fill #0

## 2018-05-06 MED FILL — ROSUVASTATIN CALCIUM 10 MG: 10 | 30 days supply | Qty: 30 | Fill #0

## 2018-05-06 MED FILL — AMLODIPINE-BENAZ 10-40 MG: 10-40 | 30 days supply | Qty: 30 | Fill #0

## 2018-05-06 NOTE — Addendum Note (Signed)
Addended by: Scarlette Calico on: 05/06/2018 10:53 AM   Modules accepted: Orders

## 2018-05-31 DIAGNOSIS — G4733 Obstructive sleep apnea (adult) (pediatric): Secondary | ICD-10-CM | POA: Diagnosis not present

## 2018-06-12 MED FILL — AMLODIPINE-BENAZ 10-40 MG: 10-40 | 30 days supply | Qty: 30 | Fill #1

## 2018-06-12 MED FILL — ROSUVASTATIN CALCIUM 10 MG: 10 | 30 days supply | Qty: 30 | Fill #1

## 2018-06-12 MED FILL — OMEPRAZOLE 20 MG CPDR: 20 | 30 days supply | Qty: 30 | Fill #1

## 2018-06-30 DIAGNOSIS — G4733 Obstructive sleep apnea (adult) (pediatric): Secondary | ICD-10-CM | POA: Diagnosis not present

## 2018-07-06 DIAGNOSIS — G4733 Obstructive sleep apnea (adult) (pediatric): Secondary | ICD-10-CM | POA: Diagnosis not present

## 2018-07-07 ENCOUNTER — Ambulatory Visit (HOSPITAL_COMMUNITY)
Admission: RE | Admit: 2018-07-07 | Discharge: 2018-07-07 | Disposition: A | Discharge status: Home / Self Care | Payer: 59 | Source: Ambulatory Visit | Attending: Internal Medicine | Admitting: Internal Medicine

## 2018-07-07 ENCOUNTER — Other Ambulatory Visit: Payer: Self-pay

## 2018-07-07 ENCOUNTER — Encounter (HOSPITAL_COMMUNITY): Payer: Self-pay | Admitting: Internal Medicine

## 2018-07-07 VITALS — BP 147/91 | HR 79 | Wt 285.6 lb

## 2018-07-07 DIAGNOSIS — Z720 Tobacco use: Secondary | ICD-10-CM | POA: Diagnosis not present

## 2018-07-07 DIAGNOSIS — Z6836 Body mass index (BMI) 36.0-36.9, adult: Secondary | ICD-10-CM | POA: Insufficient documentation

## 2018-07-07 DIAGNOSIS — K219 Gastro-esophageal reflux disease without esophagitis: Secondary | ICD-10-CM | POA: Diagnosis not present

## 2018-07-07 DIAGNOSIS — E785 Hyperlipidemia, unspecified: Secondary | ICD-10-CM | POA: Insufficient documentation

## 2018-07-07 DIAGNOSIS — E7439 Other disorders of intestinal carbohydrate absorption: Secondary | ICD-10-CM | POA: Insufficient documentation

## 2018-07-07 DIAGNOSIS — M791 Myalgia, unspecified site: Secondary | ICD-10-CM | POA: Diagnosis not present

## 2018-07-07 DIAGNOSIS — Z79899 Other long term (current) drug therapy: Secondary | ICD-10-CM | POA: Diagnosis not present

## 2018-07-07 DIAGNOSIS — I10 Essential (primary) hypertension: Secondary | ICD-10-CM | POA: Insufficient documentation

## 2018-07-07 DIAGNOSIS — G4733 Obstructive sleep apnea (adult) (pediatric): Secondary | ICD-10-CM | POA: Insufficient documentation

## 2018-07-07 DIAGNOSIS — E669 Obesity, unspecified: Secondary | ICD-10-CM | POA: Diagnosis not present

## 2018-07-07 DIAGNOSIS — E782 Mixed hyperlipidemia: Secondary | ICD-10-CM

## 2018-07-07 LAB — COMPREHENSIVE METABOLIC PANEL
ALT: 45 U/L — ABNORMAL HIGH (ref 0–44)
AST: 48 U/L — ABNORMAL HIGH (ref 15–41)
Albumin: 3.8 g/dL (ref 3.5–5.0)
Alkaline Phosphatase: 83 U/L (ref 38–126)
Anion gap: 10 (ref 5–15)
BUN: 14 mg/dL (ref 6–20)
CHLORIDE: 105 mmol/L (ref 98–111)
CO2: 26 mmol/L (ref 22–32)
Calcium: 9.1 mg/dL (ref 8.9–10.3)
Creatinine, Ser: 1.03 mg/dL (ref 0.61–1.24)
GFR calc non Af Amer: >60 mL/min (ref >60)
Glucose, Bld: 107 mg/dL — ABNORMAL HIGH (ref 70–99)
POTASSIUM: 3.4 mmol/L — AB (ref 3.5–5.1)
Sodium: 141 mmol/L (ref 135–145)
Total Bilirubin: 0.5 mg/dL (ref 0.3–1.2)
Total Protein: 7.6 g/dL (ref 6.5–8.1)

## 2018-07-07 LAB — LIPID PANEL
Cholesterol: 161 mg/dL (ref 0–200)
HDL: 41 mg/dL (ref >40)
LDL CALC: 110 mg/dL — AB (ref 0–99)
Total CHOL/HDL Ratio: 3.9 RATIO
Triglycerides: 50 mg/dL (ref <150)
VLDL: 10 mg/dL (ref 0–40)

## 2018-07-07 LAB — HEMOGLOBIN A1C
HEMOGLOBIN A1C: 6.4 % — AB (ref 4.8–5.6)
MEAN PLASMA GLUCOSE: 136.98 mg/dL

## 2018-07-07 MED FILL — ROSUVASTATIN CALCIUM 10 MG: 10 | 30 days supply | Qty: 30 | Fill #2

## 2018-07-07 MED FILL — OMEPRAZOLE 20 MG CPDR: 20 | 30 days supply | Qty: 30 | Fill #2

## 2018-07-07 MED FILL — AMLODIPINE-BENAZ 10-40 MG: 10-40 | 30 days supply | Qty: 30 | Fill #2

## 2018-07-07 NOTE — Patient Instructions (Addendum)
Routine lab work today. Will notify you of abnormal results  Follow up in 6 months with Dr.Bensimhon Please call our office at [phone] in March to schedule your April appointment.  Please take all of your medications as prescribed.

## 2018-07-07 NOTE — Progress Notes (Signed)
   CARDIOLOGY CLINIC NOTE  Patient ID: Mark Nichols, male   DOB: Nov 07, 1970, 47 y.o.   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 Underwent cardiac CT which showed normal coronaries in 9/12.  Could not tolerate atorva due to myalgias. Now on crestor 20 qod.   He returns today for follow up. Last seen 03/2015. Says he is feeling better. Less fatigued now that he is using CPAP.  SBPs have been running 120-130 when he checks it at work. No CP or SOB. Not exercising. Still smoking < 1/2 PPD. Has been off Crestor for 1 year due to severe myalgias. Restarted 1 month ago.    Lipids rechecked in 11/12:  (LDL down from 183)  Lab Results  Component Value Date   CHOL 212 (H) 04/18/2015   HDL 47 04/18/2015   LDLCALC 150 (H) 04/18/2015   LDLDIRECT 183.0 06/04/2011   TRIG 76 04/18/2015   CHOLHDL 4.5 04/18/2015    Review of systems complete and found to be negative unless listed in HPI.   Past Medical History:  Diagnosis Date  . GERD (gastroesophageal reflux disease)     Current Outpatient Medications  Medication Sig Dispense Refill  . amLODipine-benazepril (LOTREL) 10-40 MG capsule Take 1 capsule by mouth daily. 30 capsule 6  . omeprazole (PRILOSEC OTC) 20 MG tablet Take 1 tablet (20 mg total) by mouth daily. 30 tablet 6  . rosuvastatin (CRESTOR) 10 MG tablet Take 1 tablet (10 mg total) by mouth daily. 30 tablet 6   No current facility-administered medications for this encounter.      PHYSICAL EXAM: Vitals:   07/07/18 0932  BP: (!) 147/91  Pulse: 79  SpO2: 100%   Wt Readings from Last 3 Encounters:  07/07/18 129.5 kg (285 lb 9.6 oz)  02/09/18 129.3 kg (285 lb)  11/26/17 127.7 kg (281 lb 8 oz)   General: Well appearing. No resp difficulty. HEENT: Normal Neck: Supple. JVP ok . Carotids 2+ bilat; no bruits. No thyromegaly or nodule noted. Cor: PMI nondisplaced. RRR, No M/G/R noted Lungs:  CTAB, normal effort. Abdomen: Soft, obese, non-tender, non-distended, no HSM. No bruits or masses. +BS  Extremities: No cyanosis, clubbing, or rash. R and LLE no edema.  Neuro: Alert & orientedx3, cranial nerves grossly intact. moves all 4 extremities w/o difficulty. Affect pleasant  ECG: NSR 78 No ST-T wave abnormalities. No ST-T wave abnormalities.    ASSESSMENT & PLAN: 1. HTN - Elevated today but seemingly good control otherwise. I have asked him to get a cuff at home - Needs to continue to work on routine exercise and some weight loss - On amlodipine-benazepril 10/40 daily 2. Hyperlipidemia - Was intolerant of Crestor due to myalgias - Now back on can cut back to 10mg  every day or every other as needed - Will repeat lipids today if LDL still > 140 consider PCSK-9 (he is reluctant to do this) 3. OSA - Continue CPAP - Follows with Dr Elsworth Soho 4. Tobacco use - counseled on quitting 5. Obesity - Body mass index is 36.67 kg/m.   Glori Bickers, MD  9:59 AM

## 2018-07-07 NOTE — Addendum Note (Signed)
Encounter addended by: Harvie Junior, CMA on: 07/07/2018 10:11 AM  Actions taken: Order list changed, Diagnosis association updated, Sign clinical note

## 2018-07-07 NOTE — Addendum Note (Signed)
Encounter addended by: Harvie Junior, CMA on: 07/07/2018 10:12 AM  Actions taken: Sign clinical note

## 2018-07-31 DIAGNOSIS — G4733 Obstructive sleep apnea (adult) (pediatric): Secondary | ICD-10-CM | POA: Diagnosis not present

## 2018-08-10 MED FILL — AMLODIPINE-BENAZ 10-40 MG: 10-40 | 30 days supply | Qty: 30 | Fill #3

## 2018-08-10 MED FILL — OMEPRAZOLE 20 MG CPDR: 20 | 30 days supply | Qty: 30 | Fill #3

## 2018-08-10 MED FILL — ROSUVASTATIN CALCIUM 10 MG: 10 | 30 days supply | Qty: 30 | Fill #3

## 2018-08-30 DIAGNOSIS — G4733 Obstructive sleep apnea (adult) (pediatric): Secondary | ICD-10-CM | POA: Diagnosis not present

## 2018-09-06 MED FILL — ROSUVASTATIN CALCIUM 10 MG: 10 | 30 days supply | Qty: 30 | Fill #4

## 2018-09-06 MED FILL — OMEPRAZOLE 20 MG CPDR: 20 | 30 days supply | Qty: 30 | Fill #4

## 2018-09-06 MED FILL — AMLODIPINE-BENAZ 10-40 MG: 10-40 | 30 days supply | Qty: 30 | Fill #4

## 2018-09-30 DIAGNOSIS — G4733 Obstructive sleep apnea (adult) (pediatric): Secondary | ICD-10-CM | POA: Diagnosis not present

## 2018-10-05 MED FILL — ROSUVASTATIN CALCIUM 10 MG: 10 | 30 days supply | Qty: 30 | Fill #5

## 2018-10-05 MED FILL — AMLODIPINE-BENAZ 10-40 MG: 10-40 | 30 days supply | Qty: 30 | Fill #5

## 2018-10-05 MED FILL — OMEPRAZOLE 20 MG CPDR: 20 | 30 days supply | Qty: 30 | Fill #5

## 2018-10-06 DIAGNOSIS — G4733 Obstructive sleep apnea (adult) (pediatric): Secondary | ICD-10-CM | POA: Diagnosis not present

## 2018-10-31 DIAGNOSIS — G4733 Obstructive sleep apnea (adult) (pediatric): Secondary | ICD-10-CM | POA: Diagnosis not present

## 2018-11-02 MED FILL — AMLODIPINE-BENAZ 10-40 MG: 10-40 | 30 days supply | Qty: 30 | Fill #6

## 2018-11-02 MED FILL — OMEPRAZOLE 20 MG CPDR: 20 | 30 days supply | Qty: 30 | Fill #6

## 2018-11-02 MED FILL — ROSUVASTATIN CALCIUM 10 MG: 10 | 30 days supply | Qty: 30 | Fill #6

## 2018-12-02 ENCOUNTER — Other Ambulatory Visit (HOSPITAL_COMMUNITY): Payer: Self-pay | Admitting: *Deleted

## 2018-12-02 MED ORDER — ROSUVASTATIN CALCIUM 10 MG PO TABS: 10.0000 mg | ORAL_TABLET | Freq: Every day | ORAL | 11 refills | Status: DC

## 2018-12-02 MED ORDER — AMLODIPINE BESY-BENAZEPRIL HCL 10-40 MG PO CAPS: 1.0000 | ORAL_CAPSULE | Freq: Every day | ORAL | 11 refills | Status: DC

## 2018-12-02 MED ORDER — OMEPRAZOLE 20 MG PO CPDR: 20.0000 mg | DELAYED_RELEASE_CAPSULE | Freq: Every day | ORAL | 11 refills | Status: DC

## 2018-12-02 MED ORDER — OMEPRAZOLE MAGNESIUM 20 MG PO TBEC: 20.0000 mg | DELAYED_RELEASE_TABLET | Freq: Every day | ORAL | 11 refills | Status: DC

## 2018-12-02 MED FILL — ROSUVASTATIN CALCIUM 10 MG: 10 | 90 days supply | Qty: 90 | Fill #0

## 2018-12-02 MED FILL — AMLODIPINE-BENAZ 10-40 MG: 10-40 | 90 days supply | Qty: 90 | Fill #0

## 2018-12-02 MED FILL — OMEPRAZOLE 20 MG CPDR: 20 | 90 days supply | Qty: 90 | Fill #0

## 2018-12-02 NOTE — Telephone Encounter (Signed)
Addressed by other means-duplicate

## 2018-12-02 NOTE — Telephone Encounter (Signed)
-----   Message from Conrad [place], NP sent at 12/02/2018  9:05 AM EST ----- Regarding: Dr Grandville Silos   Please refill norvasc lisinopril, crestor, protonix   Send in 12 month refill to [facility]    Dr Grandville Silos stopped me in the hospital and said he cant get through to the clinic to get his refills.   Thanks Amy

## 2019-01-04 DIAGNOSIS — G4733 Obstructive sleep apnea (adult) (pediatric): Secondary | ICD-10-CM | POA: Diagnosis not present

## 2019-03-04 MED FILL — OMEPRAZOLE 20 MG CPDR: 20 | 90 days supply | Qty: 90 | Fill #1

## 2019-03-04 MED FILL — AMLODIPINE-BENAZ 10-40 MG: 10-40 | 90 days supply | Qty: 90 | Fill #1

## 2019-03-04 MED FILL — ROSUVASTATIN CALCIUM 10 MG: 10 | 90 days supply | Qty: 90 | Fill #1

## 2019-03-17 ENCOUNTER — Other Ambulatory Visit: Payer: Self-pay

## 2019-03-17 ENCOUNTER — Ambulatory Visit: Payer: 59 | Admitting: Adult Health

## 2019-03-17 ENCOUNTER — Encounter: Payer: Self-pay | Admitting: Adult Health

## 2019-03-17 DIAGNOSIS — G4733 Obstructive sleep apnea (adult) (pediatric): Secondary | ICD-10-CM | POA: Diagnosis not present

## 2019-03-17 DIAGNOSIS — Z720 Tobacco use: Secondary | ICD-10-CM

## 2019-03-17 NOTE — Patient Instructions (Signed)
Wears CPAP each night Goal was to wear CPAP for at least 4 to 6 hours Work on healthy weight Do not drive if sleepy Work on quitting smoking Follow-up with Dr. Elsworth Soho in 1 year and as needed

## 2019-03-17 NOTE — Assessment & Plan Note (Signed)
Well-controlled on CPAP with reported good compliance.  CPAP download is requested.  Encouraged on healthy weight loss  Plan  Patient Instructions  Wears CPAP each night Goal was to wear CPAP for at least 4 to 6 hours Work on healthy weight Do not drive if sleepy Work on quitting smoking Follow-up with Dr. Elsworth Soho in 1 year and as needed

## 2019-03-17 NOTE — Assessment & Plan Note (Signed)
Healthy weight loss 

## 2019-03-17 NOTE — Progress Notes (Signed)
@Patient  ID: Mark Nichols, male    DOB: September 03, 1971, 48 y.o.   MRN: [id number]  Chief Complaint  Patient presents with  . 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  Patient is on nocturnal CPAP at bedtime. Patient says he is wearing it each night . Gets in about 4-5 hr sleep each night . Wears the entire night . Feels rested with no daytime sleepiness. Feels he benefits from CPAP . Pressure is comfortable.  Had swap out CPAP out with new machine. Working well now .  CPAP download was requested  Discussed healthy weight loss and smoking cessation.       Allergies  Allergen Reactions  . Chloroquine      There is no immunization history on file for this patient.  Past Medical History:  Diagnosis Date  . GERD (gastroesophageal reflux disease)     Tobacco History: Social History   Tobacco Use  Smoking Status Current Some Day Smoker  Smokeless Tobacco Never Used  Tobacco Comment   Patient smokes rarely    Ready to quit: Not Answered Counseling given: Not Answered Comment: Patient smokes rarely    Outpatient Medications Prior to Visit  Medication Sig Dispense Refill  . amLODipine-benazepril (LOTREL) 10-40 MG capsule Take 1 capsule by mouth daily. 30 capsule 11  . omeprazole (PRILOSEC) 20 MG capsule Take 1 capsule (20 mg total) by mouth daily. 30 capsule 11  . rosuvastatin (CRESTOR) 10 MG tablet Take 1 tablet (10 mg total) by mouth daily. 30 tablet 11   No facility-administered medications prior to visit.      Review of Systems:   Constitutional:   No  weight loss, night sweats,  Fevers, chills, fatigue, or  lassitude.  HEENT:   No headaches,  Difficulty swallowing,  Tooth/dental problems,  or  Sore throat,                No sneezing, itching, ear ache, nasal congestion, post nasal drip,   CV:  No chest pain,  Orthopnea, PND, swelling in lower extremities, anasarca, dizziness, palpitations, syncope.   GI  No heartburn, indigestion, abdominal pain, nausea, vomiting, diarrhea, change in bowel habits, loss of appetite, bloody stools.   Resp: No shortness of breath with exertion or at rest.  No excess mucus, no productive cough,  No non-productive cough,  No coughing up of blood.  No change in color of mucus.  No wheezing.  No chest wall deformity  Skin: no rash or lesions.  GU: no dysuria, change in color of urine, no urgency or frequency.  No flank pain, no hematuria   MS:  No joint pain or swelling.  No decreased range of motion.  No back pain.    Physical Exam  BP 124/78 (BP Location: Left Arm, Patient Position: Sitting, Cuff Size: Large)   Pulse 65   Temp 98.3 F (36.8 C) (Oral)   Ht 6\' 2"  (1.88 m)   Wt 277 lb 12.8 oz (126 kg)   SpO2 100%   BMI 35.67 kg/m   GEN: A/Ox3; pleasant , NAD, obese    HEENT:  [place]/AT , THROAT-clear, no lesions, no postnasal drip or exudate noted. Class 3 MP airway  NECK:  Supple w/ fair ROM; no JVD; normal carotid impulses w/o bruits; no thyromegaly or nodules palpated; no lymphadenopathy.    RESP  Clear  P & A; w/o, wheezes/ rales/ or rhonchi. no accessory muscle use, no dullness to percussion  CARD:  RRR, no m/r/g, no peripheral edema, pulses intact, no cyanosis or clubbing.   Musco: Warm bil, no deformities or joint swelling noted.   Neuro: alert, no focal deficits noted.    Skin: Warm, no lesions or rashes    Lab Results:  BNP No results found for: BNP  ProBNP No results found for: PROBNP  Imaging: No results found.    No flowsheet data found.  No results found for: NITRICOXIDE      Assessment & Plan:   No problem-specific Assessment & Plan notes found for this encounter.     Rexene Edison, NP  03/17/2019

## 2019-03-17 NOTE — Assessment & Plan Note (Signed)
Smoking cessation  

## 2019-05-04 DIAGNOSIS — G4733 Obstructive sleep apnea (adult) (pediatric): Secondary | ICD-10-CM | POA: Diagnosis not present

## 2019-06-06 MED FILL — ROSUVASTATIN CALCIUM 10 MG: 10 | 90 days supply | Qty: 90 | Fill #2

## 2019-06-06 MED FILL — AMLODIPINE-BENAZ 10-40 MG: 10-40 | 90 days supply | Qty: 90 | Fill #2

## 2019-06-07 MED FILL — OMEPRAZOLE 20 MG CAP: 20 | 90 days supply | Qty: 90 | Fill #2

## 2019-07-26 DIAGNOSIS — H11153 Pinguecula, bilateral: Secondary | ICD-10-CM | POA: Diagnosis not present

## 2019-07-26 DIAGNOSIS — H35033 Hypertensive retinopathy, bilateral: Secondary | ICD-10-CM | POA: Diagnosis not present

## 2019-07-26 DIAGNOSIS — Z8619 Personal history of other infectious and parasitic diseases: Secondary | ICD-10-CM | POA: Diagnosis not present

## 2019-07-26 DIAGNOSIS — H11823 Conjunctivochalasis, bilateral: Secondary | ICD-10-CM | POA: Diagnosis not present

## 2019-08-04 DIAGNOSIS — G4733 Obstructive sleep apnea (adult) (pediatric): Secondary | ICD-10-CM | POA: Diagnosis not present

## 2019-09-02 MED FILL — OMEPRAZOLE 20 MG CAP: 20 | 90 days supply | Qty: 90 | Fill #3

## 2019-09-02 MED FILL — AMLODIPINE-BENAZ 10-40 MG: 10-40 | 90 days supply | Qty: 90 | Fill #3

## 2019-09-02 MED FILL — ROSUVASTATIN CALCIUM 10 MG: 10 | 90 days supply | Qty: 90 | Fill #3

## 2019-11-02 DIAGNOSIS — G4733 Obstructive sleep apnea (adult) (pediatric): Secondary | ICD-10-CM | POA: Diagnosis not present

## 2019-11-25 ENCOUNTER — Other Ambulatory Visit (HOSPITAL_COMMUNITY): Payer: Self-pay | Admitting: *Deleted

## 2019-11-25 MED ORDER — ROSUVASTATIN CALCIUM 10 MG PO TABS: 10.0000 mg | ORAL_TABLET | Freq: Every day | ORAL | 2 refills | Status: DC

## 2019-11-25 MED ORDER — AMLODIPINE BESY-BENAZEPRIL HCL 10-40 MG PO CAPS: 1.0000 | ORAL_CAPSULE | Freq: Every day | ORAL | 2 refills | Status: DC

## 2019-11-25 MED ORDER — OMEPRAZOLE 20 MG PO CPDR: 20.0000 mg | DELAYED_RELEASE_CAPSULE | Freq: Every day | ORAL | 2 refills | Status: DC

## 2019-11-25 MED FILL — AMLODIPINE-BENAZ 10-40 MG: 10-40 | 90 days supply | Qty: 90 | Fill #0

## 2019-11-25 MED FILL — ROSUVASTATIN CALCIUM 10 MG: 10 | 90 days supply | Qty: 90 | Fill #0

## 2019-11-25 MED FILL — OMEPRAZOLE 20 MG CAP: 20 | 90 days supply | Qty: 90 | Fill #0

## 2020-01-31 DIAGNOSIS — G4733 Obstructive sleep apnea (adult) (pediatric): Secondary | ICD-10-CM | POA: Diagnosis not present

## 2020-02-29 MED FILL — AMLODIPINE-BENAZ 10-40 MG: 10-40 | 90 days supply | Qty: 90 | Fill #1

## 2020-05-01 DIAGNOSIS — G4733 Obstructive sleep apnea (adult) (pediatric): Secondary | ICD-10-CM | POA: Diagnosis not present

## 2020-05-26 MED FILL — AMLODIPINE-BENAZ 10-40 MG: 10-40 | 90 days supply | Qty: 90 | Fill #2

## 2020-05-26 MED FILL — ROSUVASTATIN CALCIUM 10 MG: 10 | 90 days supply | Qty: 90 | Fill #1

## 2020-05-26 MED FILL — OMEPRAZOLE 20 MG CAP: 20 | 90 days supply | Qty: 90 | Fill #1

## 2020-07-31 DIAGNOSIS — G4733 Obstructive sleep apnea (adult) (pediatric): Secondary | ICD-10-CM | POA: Diagnosis not present

## 2020-08-30 ENCOUNTER — Other Ambulatory Visit (HOSPITAL_COMMUNITY): Payer: Self-pay | Admitting: Internal Medicine

## 2020-08-30 MED FILL — OMEPRAZOLE 20 MG CAP: 20 | 90 days supply | Qty: 90 | Fill #2

## 2020-08-30 MED FILL — ROSUVASTATIN CALCIUM 10 MG: 10 | 90 days supply | Qty: 90 | Fill #2

## 2020-08-31 ENCOUNTER — Other Ambulatory Visit (HOSPITAL_COMMUNITY): Payer: Self-pay | Admitting: Internal Medicine

## 2020-08-31 MED FILL — AMLODIPINE-BENAZ 10-40 MG: 10-40 | 90 days supply | Qty: 90 | Fill #0

## 2020-10-25 DIAGNOSIS — H35033 Hypertensive retinopathy, bilateral: Secondary | ICD-10-CM | POA: Diagnosis not present

## 2020-10-25 DIAGNOSIS — Z8619 Personal history of other infectious and parasitic diseases: Secondary | ICD-10-CM | POA: Diagnosis not present

## 2020-10-25 DIAGNOSIS — H11153 Pinguecula, bilateral: Secondary | ICD-10-CM | POA: Diagnosis not present

## 2020-10-25 DIAGNOSIS — H11823 Conjunctivochalasis, bilateral: Secondary | ICD-10-CM | POA: Diagnosis not present

## 2020-10-31 DIAGNOSIS — G4733 Obstructive sleep apnea (adult) (pediatric): Secondary | ICD-10-CM | POA: Diagnosis not present

## 2020-11-30 ENCOUNTER — Other Ambulatory Visit (HOSPITAL_COMMUNITY): Payer: Self-pay | Admitting: Cardiology

## 2020-11-30 DIAGNOSIS — E7849 Other hyperlipidemia: Secondary | ICD-10-CM

## 2020-11-30 DIAGNOSIS — K219 Gastro-esophageal reflux disease without esophagitis: Secondary | ICD-10-CM

## 2020-11-30 DIAGNOSIS — I10 Essential (primary) hypertension: Secondary | ICD-10-CM

## 2020-11-30 MED ORDER — AMLODIPINE BESY-BENAZEPRIL HCL 10-40 MG PO CAPS: 1.0000 | ORAL_CAPSULE | Freq: Every day | ORAL | 0 refills | Status: DC

## 2020-11-30 MED ORDER — OMEPRAZOLE 20 MG PO CPDR: 20.0000 mg | DELAYED_RELEASE_CAPSULE | Freq: Every day | ORAL | 2 refills | Status: DC

## 2020-11-30 MED ORDER — ROSUVASTATIN CALCIUM 10 MG PO TABS: 10.0000 mg | ORAL_TABLET | Freq: Every day | ORAL | 2 refills | Status: DC

## 2020-11-30 MED FILL — OMEPRAZOLE 20 MG CAP: 20 | 90 days supply | Qty: 90 | Fill #0

## 2020-11-30 MED FILL — AMLODIPINE-BENAZ 10-40 MG: 10-40 | 90 days supply | Qty: 90 | Fill #0

## 2020-11-30 MED FILL — ROSUVASTATIN CALCIUM 10 MG: 10 | 90 days supply | Qty: 90 | Fill #0

## 2020-12-11 ENCOUNTER — Other Ambulatory Visit (HOSPITAL_COMMUNITY): Payer: Self-pay | Admitting: Infectious Diseases

## 2020-12-11 DIAGNOSIS — Z23 Encounter for immunization: Secondary | ICD-10-CM | POA: Diagnosis not present

## 2020-12-11 DIAGNOSIS — Z7184 Encounter for health counseling related to travel: Secondary | ICD-10-CM | POA: Diagnosis not present

## 2020-12-17 DIAGNOSIS — Z1159 Encounter for screening for other viral diseases: Secondary | ICD-10-CM | POA: Diagnosis not present

## 2020-12-17 DIAGNOSIS — Z20822 Contact with and (suspected) exposure to covid-19: Secondary | ICD-10-CM | POA: Diagnosis not present

## 2020-12-17 MED FILL — AZITHROMYCIN 250 MG TABS: 250 | 6 days supply | Qty: 12 | Fill #0

## 2020-12-17 MED FILL — ATOVAQUONE-PROGUANIL 250-10: 250-100 | 17 days supply | Qty: 17 | Fill #0

## 2020-12-18 ENCOUNTER — Telehealth: Payer: Self-pay

## 2020-12-18 ENCOUNTER — Other Ambulatory Visit: Payer: Self-pay | Admitting: Internal Medicine

## 2020-12-18 MED ORDER — PAXLOVID 20 X 150 MG & 10 X 100MG PO TBPK: 3.0000 | ORAL_TABLET | Freq: Two times a day (BID) | ORAL | 0 refills | Status: AC

## 2020-12-18 NOTE — Telephone Encounter (Signed)
Received call from [place] where patient tried to fill Paxlovid prescription written by provder. Due to EUA stipulations, patient must have positive test in order to receive medication. Contacted patient to notify him of this. Left VM requesting call back with any questions. Provider also notified.   Monish [place] Lorita Officer, RN

## 2020-12-18 NOTE — Progress Notes (Signed)
Patient going to [place] for funeral. Giving paxlovid in the event he acquires covid while in [place], where there is limited access to  treatment for covid. To take if needed.

## 2021-02-08 DIAGNOSIS — G4733 Obstructive sleep apnea (adult) (pediatric): Secondary | ICD-10-CM | POA: Diagnosis not present

## 2021-02-22 ENCOUNTER — Other Ambulatory Visit (HOSPITAL_COMMUNITY): Payer: Self-pay

## 2021-02-22 ENCOUNTER — Other Ambulatory Visit (HOSPITAL_COMMUNITY): Payer: Self-pay | Admitting: *Deleted

## 2021-02-22 DIAGNOSIS — I10 Essential (primary) hypertension: Secondary | ICD-10-CM

## 2021-02-22 DIAGNOSIS — E7849 Other hyperlipidemia: Secondary | ICD-10-CM

## 2021-02-22 DIAGNOSIS — K219 Gastro-esophageal reflux disease without esophagitis: Secondary | ICD-10-CM

## 2021-02-22 MED ORDER — ROSUVASTATIN CALCIUM 10 MG PO TABS
10.0000 mg | ORAL_TABLET | Freq: Every day | ORAL | 1 refills | Status: DC
  Filled 2021-02-22: qty 90, 90d supply, fill #0
  Filled 2021-05-23: qty 90, 90d supply, fill #1

## 2021-02-22 MED ORDER — OMEPRAZOLE 20 MG PO CPDR
20.0000 mg | DELAYED_RELEASE_CAPSULE | Freq: Every day | ORAL | 1 refills | Status: DC
  Filled 2021-02-22: qty 90, 90d supply, fill #0

## 2021-02-22 MED ORDER — AMLODIPINE BESY-BENAZEPRIL HCL 10-40 MG PO CAPS
ORAL_CAPSULE | ORAL | 1 refills | Status: DC
  Filled 2021-02-22: qty 90, 90d supply, fill #0
  Filled 2021-05-23: qty 90, 90d supply, fill #1

## 2021-05-17 DIAGNOSIS — G4733 Obstructive sleep apnea (adult) (pediatric): Secondary | ICD-10-CM | POA: Diagnosis not present

## 2021-05-23 ENCOUNTER — Other Ambulatory Visit (HOSPITAL_COMMUNITY): Payer: Self-pay

## 2021-06-11 ENCOUNTER — Ambulatory Visit (HOSPITAL_COMMUNITY)
Admission: RE | Admit: 2021-06-11 | Discharge: 2021-06-11 | Disposition: A | Discharge status: Home / Self Care | Payer: 59 | Source: Ambulatory Visit | Attending: Internal Medicine | Admitting: Internal Medicine

## 2021-06-11 ENCOUNTER — Other Ambulatory Visit (HOSPITAL_COMMUNITY): Payer: Self-pay

## 2021-06-11 ENCOUNTER — Encounter (HOSPITAL_COMMUNITY): Payer: Self-pay | Admitting: Internal Medicine

## 2021-06-11 ENCOUNTER — Other Ambulatory Visit: Payer: Self-pay

## 2021-06-11 ENCOUNTER — Telehealth (HOSPITAL_COMMUNITY): Payer: Self-pay | Admitting: *Deleted

## 2021-06-11 VITALS — BP 160/90 | HR 76 | Wt 281.8 lb

## 2021-06-11 DIAGNOSIS — G4733 Obstructive sleep apnea (adult) (pediatric): Secondary | ICD-10-CM | POA: Diagnosis not present

## 2021-06-11 DIAGNOSIS — Z79899 Other long term (current) drug therapy: Secondary | ICD-10-CM | POA: Diagnosis not present

## 2021-06-11 DIAGNOSIS — E876 Hypokalemia: Secondary | ICD-10-CM

## 2021-06-11 DIAGNOSIS — E669 Obesity, unspecified: Secondary | ICD-10-CM | POA: Diagnosis not present

## 2021-06-11 DIAGNOSIS — E785 Hyperlipidemia, unspecified: Secondary | ICD-10-CM | POA: Diagnosis not present

## 2021-06-11 DIAGNOSIS — K219 Gastro-esophageal reflux disease without esophagitis: Secondary | ICD-10-CM | POA: Diagnosis not present

## 2021-06-11 DIAGNOSIS — I10 Essential (primary) hypertension: Secondary | ICD-10-CM | POA: Insufficient documentation

## 2021-06-11 DIAGNOSIS — R079 Chest pain, unspecified: Secondary | ICD-10-CM | POA: Diagnosis not present

## 2021-06-11 DIAGNOSIS — F17200 Nicotine dependence, unspecified, uncomplicated: Secondary | ICD-10-CM | POA: Insufficient documentation

## 2021-06-11 DIAGNOSIS — R0789 Other chest pain: Secondary | ICD-10-CM | POA: Insufficient documentation

## 2021-06-11 DIAGNOSIS — Z6836 Body mass index (BMI) 36.0-36.9, adult: Secondary | ICD-10-CM | POA: Insufficient documentation

## 2021-06-11 LAB — COMPREHENSIVE METABOLIC PANEL
ALT: 28 U/L (ref 0–44)
AST: 27 U/L (ref 15–41)
Albumin: 3.6 g/dL (ref 3.5–5.0)
Alkaline Phosphatase: 76 U/L (ref 38–126)
Anion gap: 11 (ref 5–15)
BUN: 11 mg/dL (ref 6–20)
CO2: 29 mmol/L (ref 22–32)
Calcium: 6.8 mg/dL — ABNORMAL LOW (ref 8.9–10.3)
Chloride: 99 mmol/L (ref 98–111)
Creatinine, Ser: 1.05 mg/dL (ref 0.61–1.24)
GFR, Estimated: >60 mL/min (ref >60)
Glucose, Bld: 108 mg/dL — ABNORMAL HIGH (ref 70–99)
Potassium: 2.6 mmol/L — CL (ref 3.5–5.1)
Sodium: 139 mmol/L (ref 135–145)
Total Bilirubin: 0.6 mg/dL (ref 0.3–1.2)
Total Protein: 9.5 g/dL — ABNORMAL HIGH (ref 6.5–8.1)

## 2021-06-11 LAB — CBC
HCT: 40.7 % (ref 39.0–52.0)
Hemoglobin: 13 g/dL (ref 13.0–17.0)
MCH: 25.5 pg — ABNORMAL LOW (ref 26.0–34.0)
MCHC: 31.9 g/dL (ref 30.0–36.0)
MCV: 80 fL (ref 80.0–100.0)
Platelets: 204 10*3/uL (ref 150–400)
RBC: 5.09 MIL/uL (ref 4.22–5.81)
RDW: 14.5 % (ref 11.5–15.5)
WBC: 4 10*3/uL (ref 4.0–10.5)
nRBC: 0 % (ref 0.0–0.2)

## 2021-06-11 LAB — C-REACTIVE PROTEIN: CRP: 1.6 mg/dL — ABNORMAL HIGH (ref <1.0)

## 2021-06-11 LAB — HEMOGLOBIN A1C
Hgb A1c MFr Bld: 6 % — ABNORMAL HIGH (ref 4.8–5.6)
Mean Plasma Glucose: 125.5 mg/dL

## 2021-06-11 LAB — LIPID PANEL
Cholesterol: 177 mg/dL (ref 0–200)
HDL: 44 mg/dL (ref >40)
LDL Cholesterol: 121 mg/dL — ABNORMAL HIGH (ref 0–99)
Total CHOL/HDL Ratio: 4 RATIO
Triglycerides: 59 mg/dL (ref <150)
VLDL: 12 mg/dL (ref 0–40)

## 2021-06-11 LAB — URIC ACID: Uric Acid, Serum: 5.9 mg/dL (ref 3.7–8.6)

## 2021-06-11 LAB — TSH: TSH: 0.94 u[IU]/mL (ref 0.350–4.500)

## 2021-06-11 MED ORDER — SPIRONOLACTONE 25 MG PO TABS
25.0000 mg | ORAL_TABLET | Freq: Every day | ORAL | 3 refills | Status: DC
  Filled 2021-06-11: qty 90, 90d supply, fill #0

## 2021-06-11 MED ORDER — POTASSIUM CHLORIDE ER 10 MEQ PO TBCR: 40.0000 meq | EXTENDED_RELEASE_TABLET | ORAL | 0 refills | Status: DC

## 2021-06-11 MED ORDER — METOPROLOL TARTRATE 100 MG PO TABS
100.0000 mg | ORAL_TABLET | Freq: Once | ORAL | 0 refills | Status: DC
  Filled 2021-06-11: qty 1, 1d supply, fill #0

## 2021-06-11 MED ORDER — EPLERENONE 25 MG PO TABS
25.0000 mg | ORAL_TABLET | Freq: Every day | ORAL | 3 refills | Status: DC
  Filled 2021-06-11: qty 90, 90d supply, fill #0

## 2021-06-11 NOTE — Addendum Note (Signed)
Encounter addended by: Scarlette Calico, RN on: 06/11/2021 11:30 AM  Actions taken: Medication long-term status modified, Order list changed, Pharmacy for encounter modified, Clinical Note Signed, Charge Capture section accepted

## 2021-06-11 NOTE — Patient Instructions (Addendum)
Start Eplerenone 25 mg Daily  Your physician has requested that you regularly monitor and record your blood pressure readings at home. Please use the same machine at the same time of day to check your readings and record them to bring to your follow-up visit.  Labs done today, your results will be available in MyChart, we will contact you for abnormal readings.  Your physician has requested that you have cardiac CT. Cardiac computed tomography (CT) is a painless test that uses an x-ray machine to take clear, detailed pictures of your heart. For further information please visit [email or web address]. Please follow instruction sheet as given. ONCE APPROVED BY YOUR INSURANCE COMPANY WE WILL CONTACT YOU TO SCHEDULE  Please call our office in February to schedule your follow up appointment  If you have any questions or concerns before your next appointment please send [place] a message through [place] or call our office at [phone].    TO LEAVE A MESSAGE FOR THE NURSE SELECT OPTION 2, PLEASE LEAVE A MESSAGE INCLUDING: YOUR NAME DATE OF BIRTH CALL BACK NUMBER REASON FOR CALL**this is important as we prioritize the call backs  YOU WILL RECEIVE A CALL BACK THE SAME DAY AS LONG AS YOU CALL BEFORE 4:00 PM   CARDIAC CT INSTRUCTIONS  Please arrive at the [facility] main entrance of [facility] on the day of your CT  Proceed to the [facility] Radiology Department (First Floor).  Please follow these instructions carefully (unless otherwise directed):  Hold all erectile dysfunction medications at least 48 hours prior to test.  On the Night Before the Test: Be sure to Drink plenty of water, but do not exceed your fluid restriction limit Do not consume any caffeinated/decaffeinated beverages or chocolate 12 hours prior to your test. Do not take any antihistamines 12 hours prior to your test.  On the Day of the Test: Drink plenty of water. Do not drink any water within one hour of the  test. Do not eat any food 4 hours prior to the test. You may take your regular medications prior to the test.  Take metoprolol (Lopressor) two hours prior to test (100 mg) HOLD diuretics (Eplerenone) morning of the test.  After the Test: Drink plenty of water. After receiving IV contrast, you may experience a mild flushed feeling. This is normal. On occasion, you may experience a mild rash up to 24 hours after the test. This is not dangerous. If this occurs, you can take Benadryl 25 mg and increase your fluid intake. If you experience trouble breathing, this can be serious. If it is severe call 911 IMMEDIATELY. If it is mild, please call our office. If you take any of these medications: Glipizide/Metformin, Avandament, Glucavance, please do not take 48 hours after completing test.

## 2021-06-11 NOTE — Telephone Encounter (Signed)
Critical lab results: K 2.6  Per Darrick Grinder, NP give pt 40 meq of KCL x 3 doses, repeat labs tomorrow  Pt aware and agreeable, rx sent in, he will come for labs tomorrow

## 2021-06-11 NOTE — Progress Notes (Signed)
   CARDIOLOGY CLINIC NOTE  Patient ID: Mark Nichols, male   DOB: 31-Aug-1971, 50 y.o.   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 Underwent cardiac CT which showed normal coronaries in 9/12.  Could not tolerate atorva due to myalgias. Now on crestor 20 qod.   He returns today for follow up. Last seen in 2019. Says he feels ok but seems to have more DOE. Not following BP closely. Had one episode of chest tightness but felt it was GI. + arthalgias. Smoking about 5/day. Only taking crestor 10 about 1x/week due to myalgias. Wearing CPAP every night.  Lab Results  Component Value Date   CHOL 161 07/07/2018   HDL 41 07/07/2018   LDLCALC 110 (H) 07/07/2018   LDLDIRECT 183.0 06/04/2011   TRIG 50 07/07/2018   CHOLHDL 3.9 07/07/2018    Review of systems complete and found to be negative unless listed in HPI.   Past Medical History:  Diagnosis Date   GERD (gastroesophageal reflux disease)     Current Outpatient Medications  Medication Sig Dispense Refill   amLODipine-benazepril (LOTREL) 10-40 MG capsule TAKE 1 CAPSULE BY MOUTH DAILY. NEEDS APPT FOR FUTURE REFILLS 90 capsule 1   omeprazole (PRILOSEC) 20 MG capsule Take 1 capsule (20 mg total) by mouth daily. Must have appt for further refills 90 capsule 1   rosuvastatin (CRESTOR) 10 MG tablet Take 1 tablet (10 mg total) by mouth daily. Must have appointment for further refills 90 tablet 1   No current facility-administered medications for this encounter.     PHYSICAL EXAM: Vitals:   06/11/21 0942  BP: (!) 160/90  Pulse: 76  SpO2: 100%   Wt Readings from Last 3 Encounters:  06/11/21 127.8 kg (281 lb 12.8 oz)  03/17/19 126 kg (277 lb 12.8 oz)  07/07/18 129.5 kg (285 lb 9.6 oz)   General:  Well appearing. No resp difficulty HEENT: normal Neck: supple. no JVD. Carotids 2+ bilat; no bruits. No lymphadenopathy or thryomegaly appreciated. Cor:  PMI nondisplaced. Regular rate & rhythm. No rubs, gallops or murmurs. Lungs: clear Abdomen: soft, nontender, nondistended. No hepatosplenomegaly. No bruits or masses. Good bowel sounds. Extremities: no cyanosis, clubbing, rash, edema Neuro: alert & orientedx3, cranial nerves grossly intact. moves all 4 extremities w/o difficulty. Affect pleasant   ECG: Sinus rhythm 70 No ST-T wave abnormalities. Personally reviewed   ASSESSMENT & PLAN:  1. HTN - Remains markedly elevated.  - Add spiro 25 daily. (May need to switch amlodipine-ACE to [facility]) . Avoiding HCTZ with possible gout - Take BP daily and report to me q2 weeks - Discussed potential need for HTN Clinic referral  - Needs to continue to work on routine exercise and some weight loss  2. Hyperlipidemia - Essentially intolerant of Crestor due to myalgias - Will repeat lipids today if LDL still > 140 refer to lipid clinic to discuss injectables  3. Chest pressure and progressive exertional dyspnea - typical and atypical symptoms - multiple CRFs - will get cardiac CTA  4. OSA - Continue CPAP - Follows with Dr Elsworth Soho  4. Tobacco use - continue to remind him of need to quit  5. Obesity - Body mass index is 36.18 kg/m. - needs weight loss efforts  6. Arthalgias - check uric acid and crp.    Glori Bickers, MD  10:21 AM

## 2021-06-12 ENCOUNTER — Ambulatory Visit (HOSPITAL_COMMUNITY)
Admission: RE | Admit: 2021-06-12 | Discharge: 2021-06-12 | Disposition: A | Discharge status: Home / Self Care | Payer: 59 | Source: Ambulatory Visit | Attending: Internal Medicine | Admitting: Internal Medicine

## 2021-06-12 ENCOUNTER — Telehealth: Payer: Self-pay | Admitting: Physician Assistant

## 2021-06-12 ENCOUNTER — Other Ambulatory Visit (HOSPITAL_COMMUNITY): Payer: Self-pay

## 2021-06-12 ENCOUNTER — Other Ambulatory Visit (HOSPITAL_COMMUNITY): Payer: Self-pay | Admitting: *Deleted

## 2021-06-12 DIAGNOSIS — I10 Essential (primary) hypertension: Secondary | ICD-10-CM | POA: Diagnosis not present

## 2021-06-12 DIAGNOSIS — E876 Hypokalemia: Secondary | ICD-10-CM | POA: Insufficient documentation

## 2021-06-12 LAB — COMPREHENSIVE METABOLIC PANEL WITH GFR
ALT: 29 U/L (ref 0–44)
AST: 29 U/L (ref 15–41)
Albumin: 3.4 g/dL — ABNORMAL LOW (ref 3.5–5.0)
Alkaline Phosphatase: 63 U/L (ref 38–126)
Anion gap: 12 (ref 5–15)
BUN: 8 mg/dL (ref 6–20)
CO2: 26 mmol/L (ref 22–32)
Calcium: 6.4 mg/dL — CL (ref 8.9–10.3)
Chloride: 101 mmol/L (ref 98–111)
Creatinine, Ser: 1.01 mg/dL (ref 0.61–1.24)
GFR, Estimated: >60 mL/min (ref >60)
Glucose, Bld: 126 mg/dL — ABNORMAL HIGH (ref 70–99)
Potassium: 2.8 mmol/L — ABNORMAL LOW (ref 3.5–5.1)
Sodium: 139 mmol/L (ref 135–145)
Total Bilirubin: 0.8 mg/dL (ref 0.3–1.2)
Total Protein: 8.5 g/dL — ABNORMAL HIGH (ref 6.5–8.1)

## 2021-06-12 LAB — MAGNESIUM: Magnesium: 0.9 mg/dL — CL (ref 1.7–2.4)

## 2021-06-12 NOTE — Telephone Encounter (Signed)
Paged by [facility] regarding low K, low Mg and low Ca. Dr. Haroldine Laws is aware and will discuss treatment plan with Dr. Grandville Silos.

## 2021-06-13 ENCOUNTER — Ambulatory Visit (HOSPITAL_COMMUNITY)
Admission: RE | Admit: 2021-06-13 | Discharge: 2021-06-13 | Disposition: A | Discharge status: Home / Self Care | Payer: 59 | Source: Ambulatory Visit | Attending: Internal Medicine | Admitting: Internal Medicine

## 2021-06-13 ENCOUNTER — Telehealth (HOSPITAL_COMMUNITY): Payer: Self-pay | Admitting: *Deleted

## 2021-06-13 ENCOUNTER — Other Ambulatory Visit: Payer: Self-pay

## 2021-06-13 ENCOUNTER — Other Ambulatory Visit (HOSPITAL_COMMUNITY): Payer: Self-pay

## 2021-06-13 DIAGNOSIS — R079 Chest pain, unspecified: Secondary | ICD-10-CM | POA: Diagnosis not present

## 2021-06-13 MED ORDER — MAGNESIUM SULFATE 50 % IJ SOLN
6.0000 g | Freq: Once | INTRAVENOUS | Status: AC
  Administered 2021-06-13: 6 g via INTRAVENOUS
  Filled 2021-06-13: qty 12

## 2021-06-13 MED ORDER — POTASSIUM CHLORIDE 20 MEQ PO PACK
80.0000 meq | PACK | Freq: Once | ORAL | Status: AC
  Administered 2021-06-13: 80 meq via ORAL
  Filled 2021-06-13: qty 4

## 2021-06-13 MED ORDER — CALCIUM GLUCONATE-NACL 2-0.675 GM/100ML-% IV SOLN
2.0000 g | Freq: Once | INTRAVENOUS | Status: AC
  Administered 2021-06-13: 2000 mg via INTRAVENOUS
  Filled 2021-06-13: qty 100

## 2021-06-13 NOTE — Progress Notes (Signed)
Opened in error

## 2021-06-13 NOTE — Telephone Encounter (Signed)
Cardiac ct in clinical review. Submitted via [facility].  Case [id number]

## 2021-06-14 ENCOUNTER — Ambulatory Visit (HOSPITAL_COMMUNITY)
Admission: RE | Admit: 2021-06-14 | Discharge: 2021-06-14 | Disposition: A | Discharge status: Home / Self Care | Payer: 59 | Source: Ambulatory Visit | Attending: Cardiology | Admitting: Cardiology

## 2021-06-14 ENCOUNTER — Telehealth (HOSPITAL_COMMUNITY): Payer: Self-pay | Admitting: *Deleted

## 2021-06-14 ENCOUNTER — Other Ambulatory Visit (HOSPITAL_COMMUNITY): Payer: Self-pay

## 2021-06-14 ENCOUNTER — Other Ambulatory Visit: Payer: Self-pay

## 2021-06-14 DIAGNOSIS — I10 Essential (primary) hypertension: Secondary | ICD-10-CM | POA: Insufficient documentation

## 2021-06-14 DIAGNOSIS — E269 Hyperaldosteronism, unspecified: Secondary | ICD-10-CM

## 2021-06-14 LAB — COMPREHENSIVE METABOLIC PANEL
ALT: 34 U/L (ref 0–44)
AST: 34 U/L (ref 15–41)
Albumin: 3.5 g/dL (ref 3.5–5.0)
Alkaline Phosphatase: 81 U/L (ref 38–126)
Anion gap: 7 (ref 5–15)
BUN: 8 mg/dL (ref 6–20)
CO2: 24 mmol/L (ref 22–32)
Calcium: 7.8 mg/dL — ABNORMAL LOW (ref 8.9–10.3)
Chloride: 103 mmol/L (ref 98–111)
Creatinine, Ser: 0.94 mg/dL (ref 0.61–1.24)
GFR, Estimated: >60 mL/min (ref >60)
Glucose, Bld: 127 mg/dL — ABNORMAL HIGH (ref 70–99)
Potassium: 3.3 mmol/L — ABNORMAL LOW (ref 3.5–5.1)
Sodium: 134 mmol/L — ABNORMAL LOW (ref 135–145)
Total Bilirubin: 0.7 mg/dL (ref 0.3–1.2)
Total Protein: 8.8 g/dL — ABNORMAL HIGH (ref 6.5–8.1)

## 2021-06-14 LAB — VITAMIN B12: Vitamin B-12: 328 pg/mL (ref 180–914)

## 2021-06-14 LAB — MAGNESIUM: Magnesium: 2.1 mg/dL (ref 1.7–2.4)

## 2021-06-14 MED ORDER — POTASSIUM CHLORIDE ER 10 MEQ PO TBCR: 40.0000 meq | EXTENDED_RELEASE_TABLET | Freq: Every day | ORAL | 3 refills | Status: DC

## 2021-06-14 MED ORDER — MAGNESIUM OXIDE -MG SUPPLEMENT 200 MG PO TABS: 200.0000 mg | ORAL_TABLET | Freq: Two times a day (BID) | ORAL | 3 refills | Status: DC

## 2021-06-14 MED ORDER — EPLERENONE 50 MG PO TABS
50.0000 mg | ORAL_TABLET | Freq: Every day | ORAL | 3 refills | Status: DC
  Filled 2021-06-14: qty 30, 30d supply, fill #0

## 2021-06-14 NOTE — Telephone Encounter (Signed)
Per Dr Haroldine Laws pt needs Cmet/Mag today and CT w/contrast of Abd/Pelvis for hyperaldosteronism  Orders placed, will arrange

## 2021-06-14 NOTE — Telephone Encounter (Signed)
Labs resulted, per Dr Haroldine Laws:   Increase Eplerenone to 50 mg Daily Take KCL 40 meq Daily Take mag-ox 200 mg BID Take OTC Calcium  Pt aware, agreeable, and verbalized understanding. Orders sent in

## 2021-06-14 NOTE — Telephone Encounter (Signed)
Outpatient in network procedures do not require pre cert. ( See below)

## 2021-06-17 ENCOUNTER — Other Ambulatory Visit (HOSPITAL_COMMUNITY): Payer: Self-pay

## 2021-06-18 ENCOUNTER — Other Ambulatory Visit (HOSPITAL_COMMUNITY): Payer: Self-pay

## 2021-06-18 ENCOUNTER — Telehealth (HOSPITAL_COMMUNITY): Payer: Self-pay | Admitting: *Deleted

## 2021-06-18 ENCOUNTER — Other Ambulatory Visit (HOSPITAL_COMMUNITY): Payer: Self-pay | Admitting: *Deleted

## 2021-06-18 MED ORDER — CARVEDILOL 6.25 MG PO TABS
ORAL_TABLET | ORAL | 0 refills | Status: DC
  Filled 2021-06-18: qty 60, 30d supply, fill #0

## 2021-06-18 MED ORDER — HYDRALAZINE HCL 10 MG PO TABS
ORAL_TABLET | ORAL | 0 refills | Status: DC
Start: 2021-06-18 — End: 2021-06-26
  Filled 2021-06-18: qty 30, 10d supply, fill #0

## 2021-06-18 MED ORDER — METOPROLOL TARTRATE 100 MG PO TABS
100.0000 mg | ORAL_TABLET | Freq: Once | ORAL | 0 refills | Status: DC
  Filled 2021-06-18: qty 1, 1d supply, fill #0

## 2021-06-18 NOTE — Telephone Encounter (Signed)
Reaching out to patient to offer assistance regarding upcoming cardiac imaging study; pt verbalizes understanding of appt date/time, parking situation and where to check in, pre-test NPO status and medications ordered; name and call back number provided for further questions should they arise  Gordy Clement RN Navigator Cardiac Imaging [facility] [phone] office [phone] cell  Patient to take 100mg  metoprolol tartrate two hours prior to cardiac CT scan.

## 2021-06-19 ENCOUNTER — Other Ambulatory Visit: Payer: Self-pay

## 2021-06-19 ENCOUNTER — Ambulatory Visit (HOSPITAL_COMMUNITY)
Admission: RE | Admit: 2021-06-19 | Discharge: 2021-06-19 | Disposition: A | Discharge status: Home / Self Care | Payer: 59 | Source: Ambulatory Visit | Attending: Cardiology | Admitting: Cardiology

## 2021-06-19 DIAGNOSIS — I2510 Atherosclerotic heart disease of native coronary artery without angina pectoris: Secondary | ICD-10-CM | POA: Insufficient documentation

## 2021-06-19 DIAGNOSIS — R079 Chest pain, unspecified: Secondary | ICD-10-CM | POA: Diagnosis present

## 2021-06-19 DIAGNOSIS — E269 Hyperaldosteronism, unspecified: Secondary | ICD-10-CM | POA: Insufficient documentation

## 2021-06-19 DIAGNOSIS — I10 Essential (primary) hypertension: Secondary | ICD-10-CM | POA: Diagnosis not present

## 2021-06-19 LAB — COMPREHENSIVE METABOLIC PANEL
ALT: 40 U/L (ref 0–44)
AST: 29 U/L (ref 15–41)
Albumin: 3.7 g/dL (ref 3.5–5.0)
Alkaline Phosphatase: 85 U/L (ref 38–126)
Anion gap: 7 (ref 5–15)
BUN: 13 mg/dL (ref 6–20)
CO2: 26 mmol/L (ref 22–32)
Calcium: 9.7 mg/dL (ref 8.9–10.3)
Chloride: 102 mmol/L (ref 98–111)
Creatinine, Ser: 1.23 mg/dL (ref 0.61–1.24)
GFR, Estimated: >60 mL/min (ref >60)
Glucose, Bld: 128 mg/dL — ABNORMAL HIGH (ref 70–99)
Potassium: 4.4 mmol/L (ref 3.5–5.1)
Sodium: 135 mmol/L (ref 135–145)
Total Bilirubin: 0.4 mg/dL (ref 0.3–1.2)
Total Protein: 9.2 g/dL — ABNORMAL HIGH (ref 6.5–8.1)

## 2021-06-19 LAB — MAGNESIUM: Magnesium: 2.1 mg/dL (ref 1.7–2.4)

## 2021-06-19 NOTE — Addendum Note (Signed)
Addended by: Scarlette Calico on: 06/19/2021 10:19 AM   Modules accepted: Orders

## 2021-06-20 ENCOUNTER — Ambulatory Visit (HOSPITAL_COMMUNITY)
Admission: RE | Admit: 2021-06-20 | Discharge: 2021-06-20 | Disposition: A | Discharge status: Home / Self Care | Payer: 59 | Source: Ambulatory Visit | Attending: Internal Medicine | Admitting: Internal Medicine

## 2021-06-20 DIAGNOSIS — R079 Chest pain, unspecified: Secondary | ICD-10-CM | POA: Diagnosis not present

## 2021-06-20 DIAGNOSIS — E269 Hyperaldosteronism, unspecified: Secondary | ICD-10-CM | POA: Diagnosis not present

## 2021-06-20 DIAGNOSIS — I10 Essential (primary) hypertension: Secondary | ICD-10-CM | POA: Diagnosis not present

## 2021-06-20 DIAGNOSIS — I2510 Atherosclerotic heart disease of native coronary artery without angina pectoris: Secondary | ICD-10-CM | POA: Diagnosis not present

## 2021-06-20 MED ORDER — NITROGLYCERIN 0.4 MG SL SUBL
0.8000 mg | SUBLINGUAL_TABLET | Freq: Once | SUBLINGUAL | Status: AC
  Administered 2021-06-20: 0.8 mg via SUBLINGUAL

## 2021-06-20 MED ORDER — IOHEXOL 350 MG/ML SOLN
95.0000 mL | Freq: Once | INTRAVENOUS | Status: AC | PRN
  Administered 2021-06-20: 95 mL via INTRAVENOUS

## 2021-06-20 MED ORDER — NITROGLYCERIN 0.4 MG SL SUBL
SUBLINGUAL_TABLET | SUBLINGUAL | Status: AC
  Filled 2021-06-20: qty 2

## 2021-06-26 ENCOUNTER — Other Ambulatory Visit (HOSPITAL_COMMUNITY): Payer: Self-pay | Admitting: *Deleted

## 2021-06-26 ENCOUNTER — Telehealth (HOSPITAL_COMMUNITY): Payer: Self-pay | Admitting: *Deleted

## 2021-06-26 ENCOUNTER — Other Ambulatory Visit (HOSPITAL_COMMUNITY): Payer: Self-pay

## 2021-06-26 DIAGNOSIS — E269 Hyperaldosteronism, unspecified: Secondary | ICD-10-CM

## 2021-06-26 MED ORDER — HYDRALAZINE HCL 10 MG PO TABS
20.0000 mg | ORAL_TABLET | Freq: Two times a day (BID) | ORAL | 6 refills | Status: DC
  Filled 2021-06-26: qty 120, 30d supply, fill #0

## 2021-06-26 NOTE — Telephone Encounter (Signed)
Per Dr Haroldine Laws pt also needs Adrenal protocol CT, pt is aware of need for both, orders placed

## 2021-06-26 NOTE — Telephone Encounter (Signed)
-----   Message from Jolaine Artist, MD sent at 06/21/2021 12:22 PM EDT ----- Please get chest CT with contrast to further evaluate mediastinal fullness (discussed with Radiology)  ----- Message ----- From: Interface, Rad Results In Sent: 06/20/2021  11:25 AM EDT To: Jolaine Artist, MD

## 2021-07-04 DIAGNOSIS — I10 Essential (primary) hypertension: Secondary | ICD-10-CM | POA: Diagnosis not present

## 2021-07-05 ENCOUNTER — Ambulatory Visit (HOSPITAL_COMMUNITY)
Admission: RE | Admit: 2021-07-05 | Discharge: 2021-07-05 | Disposition: A | Discharge status: Home / Self Care | Payer: 59 | Source: Ambulatory Visit | Attending: Internal Medicine | Admitting: Internal Medicine

## 2021-07-05 ENCOUNTER — Other Ambulatory Visit (HOSPITAL_COMMUNITY): Payer: Self-pay | Admitting: Internal Medicine

## 2021-07-05 ENCOUNTER — Other Ambulatory Visit: Payer: Self-pay

## 2021-07-05 ENCOUNTER — Encounter (HOSPITAL_COMMUNITY): Payer: Self-pay

## 2021-07-05 DIAGNOSIS — I700 Atherosclerosis of aorta: Secondary | ICD-10-CM | POA: Diagnosis not present

## 2021-07-05 DIAGNOSIS — E269 Hyperaldosteronism, unspecified: Secondary | ICD-10-CM

## 2021-07-05 DIAGNOSIS — R911 Solitary pulmonary nodule: Secondary | ICD-10-CM | POA: Diagnosis not present

## 2021-07-05 DIAGNOSIS — E279 Disorder of adrenal gland, unspecified: Secondary | ICD-10-CM | POA: Diagnosis not present

## 2021-07-05 MED ORDER — IOHEXOL 350 MG/ML SOLN
100.0000 mL | Freq: Once | INTRAVENOUS | Status: AC | PRN
  Administered 2021-07-05: 100 mL via INTRAVENOUS

## 2021-07-09 ENCOUNTER — Inpatient Hospital Stay: Payer: 59 | Attending: Hematology | Admitting: Hematology

## 2021-07-09 ENCOUNTER — Other Ambulatory Visit: Payer: Self-pay

## 2021-07-09 ENCOUNTER — Inpatient Hospital Stay: Payer: 59

## 2021-07-09 VITALS — BP 167/104 | HR 83 | Temp 97.8°F | Resp 18 | Ht 74.0 in | Wt 272.2 lb

## 2021-07-09 DIAGNOSIS — R591 Generalized enlarged lymph nodes: Secondary | ICD-10-CM | POA: Insufficient documentation

## 2021-07-09 DIAGNOSIS — I10 Essential (primary) hypertension: Secondary | ICD-10-CM | POA: Insufficient documentation

## 2021-07-09 DIAGNOSIS — Z79899 Other long term (current) drug therapy: Secondary | ICD-10-CM | POA: Diagnosis not present

## 2021-07-09 DIAGNOSIS — F17210 Nicotine dependence, cigarettes, uncomplicated: Secondary | ICD-10-CM | POA: Insufficient documentation

## 2021-07-09 LAB — LACTATE DEHYDROGENASE: LDH: 160 U/L (ref 98–192)

## 2021-07-09 LAB — CBC WITH DIFFERENTIAL/PLATELET
Abs Immature Granulocytes: 0.02 10*3/uL (ref 0.00–0.07)
Basophils Absolute: 0 10*3/uL (ref 0.0–0.1)
Basophils Relative: 1 %
Eosinophils Absolute: 0.1 10*3/uL (ref 0.0–0.5)
Eosinophils Relative: 1 %
HCT: 40.6 % (ref 39.0–52.0)
Hemoglobin: 13.3 g/dL (ref 13.0–17.0)
Immature Granulocytes: 1 %
Lymphocytes Relative: 29 %
Lymphs Abs: 1.2 10*3/uL (ref 0.7–4.0)
MCH: 25.9 pg — ABNORMAL LOW (ref 26.0–34.0)
MCHC: 32.8 g/dL (ref 30.0–36.0)
MCV: 79.1 fL — ABNORMAL LOW (ref 80.0–100.0)
Monocytes Absolute: 0.3 10*3/uL (ref 0.1–1.0)
Monocytes Relative: 8 %
Neutro Abs: 2.5 10*3/uL (ref 1.7–7.7)
Neutrophils Relative %: 60 %
Platelets: 210 10*3/uL (ref 150–400)
RBC: 5.13 MIL/uL (ref 4.22–5.81)
RDW: 14.9 % (ref 11.5–15.5)
WBC: 4.1 10*3/uL (ref 4.0–10.5)
nRBC: 0 % (ref 0.0–0.2)

## 2021-07-09 LAB — CMP (CANCER CENTER ONLY)
ALT: 46 U/L — ABNORMAL HIGH (ref 0–44)
AST: 23 U/L (ref 15–41)
Albumin: 3.9 g/dL (ref 3.5–5.0)
Alkaline Phosphatase: 77 U/L (ref 38–126)
Anion gap: 10 (ref 5–15)
BUN: 12 mg/dL (ref 6–20)
CO2: 22 mmol/L (ref 22–32)
Calcium: 9.3 mg/dL (ref 8.9–10.3)
Chloride: 105 mmol/L (ref 98–111)
Creatinine: 1.24 mg/dL (ref 0.61–1.24)
GFR, Estimated: >60 mL/min (ref >60)
Glucose, Bld: 103 mg/dL — ABNORMAL HIGH (ref 70–99)
Potassium: 3.7 mmol/L (ref 3.5–5.1)
Sodium: 137 mmol/L (ref 135–145)
Total Bilirubin: 0.3 mg/dL (ref 0.3–1.2)
Total Protein: 9.5 g/dL — ABNORMAL HIGH (ref 6.5–8.1)

## 2021-07-09 LAB — HEPATITIS B SURFACE ANTIGEN: Hepatitis B Surface Ag: NONREACTIVE

## 2021-07-09 LAB — MAGNESIUM: Magnesium: 1.9 mg/dL (ref 1.7–2.4)

## 2021-07-09 LAB — HEPATITIS B CORE ANTIBODY, TOTAL: Hep B Core Total Ab: NONREACTIVE

## 2021-07-09 LAB — VITAMIN D 25 HYDROXY (VIT D DEFICIENCY, FRACTURES): Vit D, 25-Hydroxy: 31.27 ng/mL (ref 30–100)

## 2021-07-09 NOTE — Progress Notes (Deleted)
Marland Kitchen   HEMATOLOGY/ONCOLOGY CONSULTATION NOTE  Date of Service: 07/09/2021  Patient Care Team: Jolaine Artist, MD as PCP - General (Cardiology)  CHIEF COMPLAINTS/PURPOSE OF CONSULTATION:  ***  HISTORY OF PRESENTING ILLNESS:  Mark Nichols is a wonderful 50 y.o. male who has been referred to [place] by Dr Merryl Hacker for evaluation and management of ***  MEDICAL HISTORY:  Past Medical History:  Diagnosis Date   GERD (gastroesophageal reflux disease)     SURGICAL HISTORY: No past surgical history on file.  SOCIAL HISTORY: Social History   Socioeconomic History   Marital status: Married    Spouse name: Not on file   Number of children: Not on file   Years of education: Not on file   Highest education level: Not on file  Occupational History   Not on file  Tobacco Use   Smoking status: Some Days   Smokeless tobacco: Never   Tobacco comments:    Patient smokes rarely   Substance and Sexual Activity   Alcohol use: Yes    Comment: occasionally   Drug use: No   Sexual activity: Not on file  Other Topics Concern   Not on file  Social History Narrative   Not on file   Social Determinants of Health   Financial Resource Strain: Not on file  Food Insecurity: Not on file  Transportation Needs: Not on file  Physical Activity: Not on file  Stress: Not on file  Social Connections: Not on file  Intimate Partner Violence: Not on file    FAMILY HISTORY: No family history on file.  ALLERGIES:  is allergic to chloroquine.  MEDICATIONS:  Current Outpatient Medications  Medication Sig Dispense Refill   amLODipine-benazepril (LOTREL) 10-40 MG capsule TAKE 1 CAPSULE BY MOUTH DAILY. NEEDS APPT FOR FUTURE REFILLS 90 capsule 1   carvedilol (COREG) 6.25 MG tablet Take 1 tablet by mouth twice daily. 60 tablet 0   eplerenone (INSPRA) 50 MG tablet Take 1 tablet (50 mg total) by mouth daily. 30 tablet 3   hydrALAZINE (APRESOLINE) 10 MG tablet Take 2 tablets (20 mg total) by mouth in the  morning and at bedtime. 60 tablet 6   Magnesium Oxide 200 MG TABS Take 1 tablet (200 mg total) by mouth in the morning and at bedtime. 60 tablet 3   omeprazole (PRILOSEC) 20 MG capsule Take 1 capsule (20 mg total) by mouth daily. Must have appt for further refills 90 capsule 1   potassium chloride (KLOR-CON) 10 MEQ tablet Take 4 tablets (40 mEq total) by mouth daily. 120 tablet 3   rosuvastatin (CRESTOR) 10 MG tablet Take 1 tablet (10 mg total) by mouth daily. Must have appointment for further refills 90 tablet 1   No current facility-administered medications for this visit.    REVIEW OF SYSTEMS:    10 Point review of Systems was done is negative except as noted above.  PHYSICAL EXAMINATION: ECOG PERFORMANCE STATUS: {CHL ONC ECOG AL:[id number]}  .There were no vitals filed for this visit. There were no vitals filed for this visit. .There is no height or weight on file to calculate BMI.  GENERAL:alert, in no acute distress and comfortable SKIN: no acute rashes, no significant lesions EYES: conjunctiva are pink and non-injected, sclera anicteric OROPHARYNX: MMM, no exudates, no oropharyngeal erythema or ulceration NECK: supple, no JVD LYMPH:  no palpable lymphadenopathy in the cervical, axillary or inguinal regions LUNGS: clear to auscultation b/l with normal respiratory effort HEART: regular rate & rhythm ABDOMEN:  normoactive  bowel sounds , non tender, not distended. Extremity: no pedal edema PSYCH: alert & oriented x 3 with fluent speech NEURO: no focal motor/sensory deficits  LABORATORY DATA:  I have reviewed the data as listed  . CBC Latest Ref Rng & Units 06/11/2021 04/18/2015 06/01/2011  WBC 4.0 - 10.5 K/uL 4.0 5.6 8.0  Hemoglobin 13.0 - 17.0 g/dL 13.0 16.0 15.1  Hematocrit 39.0 - 52.0 % 40.7 46.7 43.6  Platelets 150 - 400 K/uL 204 202 216    . CMP Latest Ref Rng & Units 06/19/2021 06/14/2021 06/12/2021  Glucose 70 - 99 mg/dL 128(H) 127(H) 126(H)  BUN 6 - 20 mg/dL 13 8 8    Creatinine 0.61 - 1.24 mg/dL 1.23 0.94 1.01  Sodium 135 - 145 mmol/L 135 134(L) 139  Potassium 3.5 - 5.1 mmol/L 4.4 3.3(L) 2.8(L)  Chloride 98 - 111 mmol/L 102 103 101  CO2 22 - 32 mmol/L 26 24 26   Calcium 8.9 - 10.3 mg/dL 9.7 7.8(L) 6.4(LL)  Total Protein 6.5 - 8.1 g/dL 9.2(H) 8.8(H) 8.5(H)  Total Bilirubin 0.3 - 1.2 mg/dL 0.4 0.7 0.8  Alkaline Phos 38 - 126 U/L 85 81 63  AST 15 - 41 U/L 29 34 29  ALT 0 - 44 U/L 40 34 29     RADIOGRAPHIC STUDIES: I have personally reviewed the radiological images as listed and agreed with the findings in the report. CT Chest W Contrast  Result Date: 07/08/2021 CLINICAL DATA:  Hyperaldosteronism, mediastinal fullness on coronary CT angiography. EXAM: CT CHEST WITH CONTRAST CT ABDOMEN WITHOUT AND WITH CONTRAST TECHNIQUE: Multidetector CT imaging of the abdomen was performed without intravenous contrast. Multidetector CT imaging of the chest and abdomen was then performed during bolus administration of intravenous contrast. CONTRAST:  134mL OMNIPAQUE IOHEXOL 350 MG/ML SOLN COMPARISON:  Coronary CT angiography and CT abdomen pelvis 06/20/2021. FINDINGS: CT CHEST WITH CONTRAST Cardiovascular: Heart is enlarged.  No pericardial effusion. Mediastinum/Nodes: Low internal jugular lymph nodes are subcentimeter in short axis size. Nodularity is seen throughout the prevascular space, measuring up to 1.6 x 5.0 cm anterior to the pulmonic trunk (2/61). There may be some associated thymic tissue. No additional mediastinal or hilar adenopathy. There are numerous axillary and subpectoral lymph nodes bilaterally, measuring up to 1.4 cm on the left (2/51). Esophagus is grossly unremarkable. Prepericardiac and distal periesophageal lymph nodes are not enlarged by CT size criteria. Lungs/Pleura: Lungs are clear. No pleural fluid. Airway is unremarkable. Musculoskeletal: Degenerative changes in the spine. No worrisome lytic or sclerotic lesions. CT ABDOMEN WITHOUT AND WITH CONTRAST  Hepatobiliary: Liver and gallbladder are unremarkable. No biliary ductal dilatation. Pancreas: Negative. Spleen: Negative. Adrenals/Urinary Tract: Right adrenal gland is unremarkable. Areas of nodular thickening in the left adrenal gland have absolute washouts of greater than 60%. Kidneys are unremarkable. Stomach/Bowel: Atherosclerotic calcification of the aorta. Gastrohepatic ligament lymph nodes are subcentimeter in short axis size, as are small bowel mesenteric lymph nodes. Vascular/Lymphatic: Stomach and visualized portions of the small bowel and colon are unremarkable. Other: No free fluid.  Mesenteries and peritoneum are unremarkable. Musculoskeletal: Degenerative changes in the spine. No worrisome lytic or sclerotic lesions. IMPRESSION: 1. Prevascular soft tissue nodules and subpectoral/axillary adenopathy bilaterally, findings highly worrisome for lymphoma. 2. Left adrenal nodular thickening/adenomas. No further follow-up is necessary. 3.  Aortic atherosclerosis (ICD10-I70.0). Electronically Signed   By: Lorin Picket M.D.   On: 07/08/2021 15:34   CT ABDOMEN PELVIS W CONTRAST  Result Date: 06/20/2021 CLINICAL DATA:  Hyperaldosteronism EXAM: CT ABDOMEN AND PELVIS  WITH CONTRAST TECHNIQUE: Multidetector CT imaging of the abdomen and pelvis was performed using the standard protocol following bolus administration of intravenous contrast. CONTRAST:  34mL OMNIPAQUE IOHEXOL 350 MG/ML SOLN COMPARISON:  None. FINDINGS: Inferior chest: The lung bases are well-aerated. Hepatobiliary: The liver is normal in size without focal abnormality. No intrahepatic or extrahepatic biliary ductal dilation. The gallbladder appears normal. Spleen: Normal in size without focal abnormality. Pancreas: No pancreatic ductal dilatation or surrounding inflammatory changes. Adrenals/Urinary Tract: The right adrenal gland appears normal. Nodular thickening of the lateral limb of the left adrenal gland measuring up to 1.5 x 1.3 cm.  Kidneys are normal, without renal calculi, focal lesion, or hydronephrosis. Bladder is unremarkable. Stomach/Bowel: The stomach, small bowel and large bowel are normal in caliber without abnormal wall thickening or surrounding inflammatory changes. The appendix is normal. Reproductive: Prostate is unremarkable. Lymphatic: No enlarged lymph nodes in the abdomen or pelvis. Vasculature: The abdominal aorta is normal in caliber. The portal venous system is patent. Other: No abdominopelvic ascites. Musculoskeletal: No aggressive osseous lesions. The soft tissues are unremarkable. IMPRESSION: There is nodular thickening of the lateral limb of the left adrenal gland measuring up to 1.5 cm, which may represent an adrenal cortical adenoma. The right adrenal gland appears normal. Electronically Signed   By: Albin Felling M.D.   On: 06/20/2021 11:44   CT ABDOMEN W WO CONTRAST  Result Date: 07/08/2021 CLINICAL DATA:  Hyperaldosteronism, mediastinal fullness on coronary CT angiography. EXAM: CT CHEST WITH CONTRAST CT ABDOMEN WITHOUT AND WITH CONTRAST TECHNIQUE: Multidetector CT imaging of the abdomen was performed without intravenous contrast. Multidetector CT imaging of the chest and abdomen was then performed during bolus administration of intravenous contrast. CONTRAST:  161mL OMNIPAQUE IOHEXOL 350 MG/ML SOLN COMPARISON:  Coronary CT angiography and CT abdomen pelvis 06/20/2021. FINDINGS: CT CHEST WITH CONTRAST Cardiovascular: Heart is enlarged.  No pericardial effusion. Mediastinum/Nodes: Low internal jugular lymph nodes are subcentimeter in short axis size. Nodularity is seen throughout the prevascular space, measuring up to 1.6 x 5.0 cm anterior to the pulmonic trunk (2/61). There may be some associated thymic tissue. No additional mediastinal or hilar adenopathy. There are numerous axillary and subpectoral lymph nodes bilaterally, measuring up to 1.4 cm on the left (2/51). Esophagus is grossly unremarkable.  Prepericardiac and distal periesophageal lymph nodes are not enlarged by CT size criteria. Lungs/Pleura: Lungs are clear. No pleural fluid. Airway is unremarkable. Musculoskeletal: Degenerative changes in the spine. No worrisome lytic or sclerotic lesions. CT ABDOMEN WITHOUT AND WITH CONTRAST Hepatobiliary: Liver and gallbladder are unremarkable. No biliary ductal dilatation. Pancreas: Negative. Spleen: Negative. Adrenals/Urinary Tract: Right adrenal gland is unremarkable. Areas of nodular thickening in the left adrenal gland have absolute washouts of greater than 60%. Kidneys are unremarkable. Stomach/Bowel: Atherosclerotic calcification of the aorta. Gastrohepatic ligament lymph nodes are subcentimeter in short axis size, as are small bowel mesenteric lymph nodes. Vascular/Lymphatic: Stomach and visualized portions of the small bowel and colon are unremarkable. Other: No free fluid.  Mesenteries and peritoneum are unremarkable. Musculoskeletal: Degenerative changes in the spine. No worrisome lytic or sclerotic lesions. IMPRESSION: 1. Prevascular soft tissue nodules and subpectoral/axillary adenopathy bilaterally, findings highly worrisome for lymphoma. 2. Left adrenal nodular thickening/adenomas. No further follow-up is necessary. 3.  Aortic atherosclerosis (ICD10-I70.0). Electronically Signed   By: Lorin Picket M.D.   On: 07/08/2021 15:34   CT CORONARY MORPH W/CTA COR W/SCORE W/CA W/CM &/OR WO/CM  Addendum Date: 06/20/2021   ADDENDUM REPORT: 06/20/2021 12:14 CLINICAL DATA:  50  year old with HTN and chest pain. EXAM: Cardiac/Coronary  CTA TECHNIQUE: The patient was scanned on a [employer]. FINDINGS: A 110 kV prospective scan was triggered in the descending thoracic aorta at 111 HU's. Axial non-contrast 3 mm slices were carried out through the heart. The data set was analyzed on a dedicated work station and scored using the [place]. Gantry rotation speed was 250 msecs and collimation was  .6 mm. 0.8 mg of sl NTG was given. The 3D data set was reconstructed in 5% intervals of the 67-82 % of the R-R cycle. Diastolic phases were analyzed on a dedicated work station using MPR, MIP and VRT modes. The patient received 80 cc of contrast. Aorta: Normal size 37 mm ascending aorta. No calcifications. No dissection. Aortic Valve:  Trileaflet.  No calcifications. Coronary Arteries:  Normal coronary origin.  Right dominance. RCA is a large dominant artery that gives rise to PDA and PLA. There is small focal calcified plaque, 0-10% stenosis. Left main is a large artery that gives rise to LAD and LCX arteries. LAD is a large vessel that has scattered calcified plaque, 0-10% stenosis. LCX is a non-dominant artery that gives rise to one large OM1 branch. There is scattered calcified plaque, 0-10% stenosis. Other findings: Normal pulmonary vein drainage into the left atrium. Normal left atrial appendage without a thrombus. Normal size of the pulmonary artery. Please see radiology report for non cardiac findings. IMPRESSION: 1. Normal coronary origin with right dominance. 2. Mild scattered calcified plaque, 0-10%, non flow limiting. (Calcium score was not performed) CAD-RADS 1. Minimal non-obstructive CAD (0-24%). Consider non-atherosclerotic causes of chest pain. Consider preventive therapy and risk factor modification. Electronically Signed   By: Candee Furbish M.D.   On: 06/20/2021 12:14   Result Date: 06/20/2021 EXAM: OVER-READ INTERPRETATION  CT CHEST The following report is an over-read performed by radiologist Dr. Vinnie Langton of [facility], [place] on 06/20/2021. This over-read does not include interpretation of cardiac or coronary anatomy or pathology. The coronary calcium score/coronary CTA interpretation by the cardiologist is attached. COMPARISON:  None. FINDINGS: In the anterior mediastinum there is some amorphous soft tissue which is incompletely imaged, but concerning for anterior mediastinal  mass. Within the visualized portions of the thorax there are no suspicious appearing pulmonary nodules or masses, there is no acute consolidative airspace disease, no pleural effusions and no pneumothorax. Visualized portions of the upper abdomen are unremarkable. There are no aggressive appearing lytic or blastic lesions noted in the visualized portions of the skeleton. IMPRESSION: 1. Anterior mediastinal mass incompletely imaged and therefore not characterized. Differential considerations include both malignant and benign etiologies. Further evaluation with contrast enhanced chest CT is recommended in the near future to better evaluate these findings. Electronically Signed: By: Vinnie Langton M.D. On: 06/20/2021 11:23    ASSESSMENT & PLAN:  ***  All of the patients questions were answered with apparent satisfaction. The patient knows to call the clinic with any problems, questions or concerns.  I spent {CHL ONC TIME VISIT - JHERD:[id number]} counseling the patient face to face. The total time spent in the appointment was {CHL ONC TIME VISIT - UDJSH:[id number]} and more than 50% was on counseling and direct patient cares.    Sullivan Lone MD [place] AAHIVMS [facility] Hematology/Oncology Physician [facility]  (Office):       [phone] (Work cell):  [phone] (Fax):           [phone]  07/09/2021 11:56 AM

## 2021-07-09 NOTE — Progress Notes (Signed)
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 male who has been referred to [place] by Dr Mark Nichols for evaluation and management of enlarged lymph nodes incidentally noted on his CT coronary angiogram to rule out lymphoma.  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  Given this cardiac risk factors he did have a CT coronary angiogram on 06/20/2021 which showed no flow-limiting lesions.  Incidentally noted to have an anterior mediastinal mass. He subsequently had a CT chest abdomen pelvis on 07/05/2021 which showed possible lymphadenopathy/enlarged thymus in the anterior mediastinum as well as numerous axillary and subpectoral lymph nodes bilaterally. He was noted to have nodular thickening of his left adrenal gland representing possible adenoma.  Patient notes that he was having some joint and muscle pains.  He was noted to be significantly hypokalemic, [name], the swelling was resolved.   No fevers no chills no drenching night sweats no sudden weight loss. No overt evidence of recent clinically obvious bladder infections.  Denies any history of COVID-19 infection. No recent unprotected body fluid exposure. He denies  fevers, chills, unexpected weight loss and night sweats. He started walking for 4 to 5 miles after his lab work last month. No recent travel outside [place] He reports being exposed to blood from a patient who was Covid-19 positive and HIV positive but he was in full protective gear.  He reports that he has talked to Dr. Serita Nichols and has a lymph node biopsy scheduled next week 23-Dec-2022.  His mother died of liver disease. There is no family history of auto-immune diseases. He has never required blood transfusions.  He smokes cigarettes socially.  MEDICAL HISTORY:  Past Medical History:  Diagnosis Date   GERD (gastroesophageal reflux disease)   Hypertension Dyslipidemia Obstructive [name] GERD Left adrenal possible adenoma Glucose intolerance Obesity .Body mass index is 34.95 kg/m. Tobacco use  SURGICAL HISTORY: No past surgical history on file.  SOCIAL HISTORY: Social History   Socioeconomic History   Marital status: Married    Spouse name: Not on file   Number of children: Not on file   Years of education: Not on file   Highest education level: Not on file  Occupational History   Not on file  Tobacco Use   Smoking status: Some Days   Smokeless tobacco: Never   Tobacco comments:    Patient smokes rarely   Substance and Sexual Activity   Alcohol use: Yes    Comment: occasionally   Drug use: No   Sexual activity: Not on file  Other Topics Concern   Not on file  Social History Narrative   Not on file   Social Determinants of Health   Financial Resource Strain: Not on file  Food Insecurity:  Not on file  Transportation Needs: Not on file  Physical Activity: Not on file  Stress: Not on file  Social Connections: Not on file  Intimate Partner Violence: Not on file    FAMILY HISTORY: Mother had a history of some liver disease  ALLERGIES:  is allergic to chloroquine.  MEDICATIONS:  Current Outpatient Medications  Medication Sig  Dispense Refill   amLODipine-benazepril (LOTREL) 10-40 MG capsule TAKE 1 CAPSULE BY MOUTH DAILY. NEEDS APPT FOR FUTURE REFILLS 90 capsule 1   carvedilol (COREG) 6.25 MG tablet Take 1 tablet by mouth twice daily. 60 tablet 0   hydrALAZINE (APRESOLINE) 10 MG tablet Take 2 tablets (20 mg total) by mouth in the morning and at bedtime. 60 tablet 6   omeprazole (PRILOSEC) 20 MG capsule Take 1 capsule (20 mg total) by mouth daily. Must have appt for further refills 90 capsule 1   rosuvastatin (CRESTOR) 10 MG tablet Take 1 tablet (10 mg total) by mouth daily. Must have appointment for further refills 90 tablet 1   eplerenone (INSPRA) 50 MG tablet Take 1 tablet (50 mg total) by mouth daily. (Patient not taking: Reported on 07/09/2021) 30 tablet 3   Magnesium Oxide 200 MG TABS Take 1 tablet (200 mg total) by mouth in the morning and at bedtime. (Patient not taking: Reported on 07/09/2021) 60 tablet 3   potassium chloride (KLOR-CON) 10 MEQ tablet Take 4 tablets (40 mEq total) by mouth daily. (Patient not taking: Reported on 07/09/2021) 120 tablet 3   No current facility-administered medications for this visit.    REVIEW OF SYSTEMS:    10 Point review of Systems was done is negative except as noted above.  PHYSICAL EXAMINATION: ECOG PERFORMANCE STATUS: 0 - Asymptomatic  . Vitals:   07/09/21 1403  BP: (!) 167/104  Pulse: 83  Resp: 18  Temp: 97.8 F (36.6 C)  SpO2: 100%   Filed Weights   07/09/21 1403  Weight: 272 lb 3.2 oz (123.5 kg)   .Body mass index is 34.95 kg/m.  GENERAL:alert, in no acute distress and comfortable SKIN: no acute rashes, no significant lesions EYES: conjunctiva are pink and non-injected, sclera anicteric OROPHARYNX: MMM, no exudates, no oropharyngeal erythema or ulceration NECK: supple, no JVD LYMPH:  just palpable left axillary lymph nodes  LUNGS: clear to auscultation b/l with normal respiratory effort HEART: regular rate & rhythm ABDOMEN:  normoactive bowel  sounds , non tender, not distended. Extremity: no pedal edema PSYCH: alert & oriented x 3 with fluent speech NEURO: no focal motor/sensory deficits  LABORATORY DATA:  I have reviewed the data as listed  . CBC Latest Ref Rng & Units 07/09/2021 06/11/2021 04/18/2015  WBC 4.0 - 10.5 K/uL 4.1 4.0 5.6  Hemoglobin 13.0 - 17.0 g/dL 13.3 13.0 16.0  Hematocrit 39.0 - 52.0 % 40.6 40.7 46.7  Platelets 150 - 400 K/uL 210 204 202    . CMP Latest Ref Rng & Units 07/15/2021 07/09/2021 06/19/2021  Glucose 70 - 99 mg/dL - 103(H) 128(H)  BUN 6 - 20 mg/dL - 12 13  Creatinine 0.61 - 1.24 mg/dL - 1.24 1.23  Sodium 135 - 145 mmol/L - 137 135  Potassium 3.5 - 5.1 mmol/L 3.6 3.7 4.4  Chloride 98 - 111 mmol/L - 105 102  CO2 22 - 32 mmol/L - 22 26  Calcium 8.9 - 10.3 mg/dL - 9.3 9.7  Total Protein 6.5 - 8.1 g/dL - 9.5(H) 9.2(H)  Total Bilirubin 0.3 - 1.2 mg/dL - 0.3 0.4  Alkaline Phos 38 - 126 U/L - 77 85  AST 15 - 41 U/L - 23 29  ALT 0 - 44 U/L - 46(H) 40   RADIOGRAPHIC STUDIES: I have personally reviewed the radiological images as listed and agreed with the findings in the report. CT Chest W Contrast  Result Date: 07/08/2021 CLINICAL DATA:  Hyperaldosteronism, mediastinal fullness on coronary CT angiography. EXAM: CT CHEST WITH CONTRAST CT ABDOMEN WITHOUT AND WITH CONTRAST TECHNIQUE: Multidetector CT imaging of the abdomen was performed without intravenous contrast. Multidetector CT imaging of the chest and abdomen was then performed during bolus administration of intravenous contrast. CONTRAST:  171mL OMNIPAQUE IOHEXOL 350 MG/ML SOLN COMPARISON:  Coronary CT angiography and CT abdomen pelvis 06/20/2021. FINDINGS: CT CHEST WITH CONTRAST Cardiovascular: Heart is enlarged.  No pericardial effusion. Mediastinum/Nodes: Low internal jugular lymph nodes are subcentimeter in short axis size. Nodularity is seen throughout the prevascular space, measuring up to 1.6 x 5.0 cm anterior to the pulmonic trunk (2/61).  There may be some associated thymic tissue. No additional mediastinal or hilar adenopathy. There are numerous axillary and subpectoral lymph nodes bilaterally, measuring up to 1.4 cm on the left (2/51). Esophagus is grossly unremarkable. Prepericardiac and distal periesophageal lymph nodes are not enlarged by CT size criteria. Lungs/Pleura: Lungs are clear. No pleural fluid. Airway is unremarkable. Musculoskeletal: Degenerative changes in the spine. No worrisome lytic or sclerotic lesions. CT ABDOMEN WITHOUT AND WITH CONTRAST Hepatobiliary: Liver and gallbladder are unremarkable. No biliary ductal dilatation. Pancreas: Negative. Spleen: Negative. Adrenals/Urinary Tract: Right adrenal gland is unremarkable. Areas of nodular thickening in the left adrenal gland have absolute washouts of greater than 60%. Kidneys are unremarkable. Stomach/Bowel: Atherosclerotic calcification of the aorta. Gastrohepatic ligament lymph nodes are subcentimeter in short axis size, as are small bowel mesenteric lymph nodes. Vascular/Lymphatic: Stomach and visualized portions of the small bowel and colon are unremarkable. Other: No free fluid.  Mesenteries and peritoneum are unremarkable. Musculoskeletal: Degenerative changes in the spine. No worrisome lytic or sclerotic lesions. IMPRESSION: 1. Prevascular soft tissue nodules and subpectoral/axillary adenopathy bilaterally, findings highly worrisome for lymphoma. 2. Left adrenal nodular thickening/adenomas. No further follow-up is necessary. 3.  Aortic atherosclerosis (ICD10-I70.0). Electronically Signed   By: Lorin Picket M.D.   On: 07/08/2021 15:34   CT ABDOMEN PELVIS W CONTRAST  Result Date: 06/20/2021 CLINICAL DATA:  Hyperaldosteronism EXAM: CT ABDOMEN AND PELVIS WITH CONTRAST TECHNIQUE: Multidetector CT imaging of the abdomen and pelvis was performed using the standard protocol following bolus administration of intravenous contrast. CONTRAST:  53mL OMNIPAQUE IOHEXOL 350 MG/ML  SOLN COMPARISON:  None. FINDINGS: Inferior chest: The lung bases are well-aerated. Hepatobiliary: The liver is normal in size without focal abnormality. No intrahepatic or extrahepatic biliary ductal dilation. The gallbladder appears normal. Spleen: Normal in size without focal abnormality. Pancreas: No pancreatic ductal dilatation or surrounding inflammatory changes. Adrenals/Urinary Tract: The right adrenal gland appears normal. Nodular thickening of the lateral limb of the left adrenal gland measuring up to 1.5 x 1.3 cm. Kidneys are normal, without renal calculi, focal lesion, or hydronephrosis. Bladder is unremarkable. Stomach/Bowel: The stomach, small bowel and large bowel are normal in caliber without abnormal wall thickening or surrounding inflammatory changes. The appendix is normal. Reproductive: Prostate is unremarkable. Lymphatic: No enlarged lymph nodes in the abdomen or pelvis. Vasculature: The abdominal aorta is normal in caliber. The portal venous system is patent. Other: No abdominopelvic ascites. Musculoskeletal: No aggressive osseous lesions. The soft tissues are unremarkable. IMPRESSION: There is nodular thickening of the  lateral limb of the left adrenal gland measuring up to 1.5 cm, which may represent an adrenal cortical adenoma. The right adrenal gland appears normal. Electronically Signed   By: Albin Felling M.D.   On: 06/20/2021 11:44   CT ABDOMEN W WO CONTRAST  Result Date: 07/08/2021 CLINICAL DATA:  Hyperaldosteronism, mediastinal fullness on coronary CT angiography. EXAM: CT CHEST WITH CONTRAST CT ABDOMEN WITHOUT AND WITH CONTRAST TECHNIQUE: Multidetector CT imaging of the abdomen was performed without intravenous contrast. Multidetector CT imaging of the chest and abdomen was then performed during bolus administration of intravenous contrast. CONTRAST:  157mL OMNIPAQUE IOHEXOL 350 MG/ML SOLN COMPARISON:  Coronary CT angiography and CT abdomen pelvis 06/20/2021. FINDINGS: CT CHEST  WITH CONTRAST Cardiovascular: Heart is enlarged.  No pericardial effusion. Mediastinum/Nodes: Low internal jugular lymph nodes are subcentimeter in short axis size. Nodularity is seen throughout the prevascular space, measuring up to 1.6 x 5.0 cm anterior to the pulmonic trunk (2/61). There may be some associated thymic tissue. No additional mediastinal or hilar adenopathy. There are numerous axillary and subpectoral lymph nodes bilaterally, measuring up to 1.4 cm on the left (2/51). Esophagus is grossly unremarkable. Prepericardiac and distal periesophageal lymph nodes are not enlarged by CT size criteria. Lungs/Pleura: Lungs are clear. No pleural fluid. Airway is unremarkable. Musculoskeletal: Degenerative changes in the spine. No worrisome lytic or sclerotic lesions. CT ABDOMEN WITHOUT AND WITH CONTRAST Hepatobiliary: Liver and gallbladder are unremarkable. No biliary ductal dilatation. Pancreas: Negative. Spleen: Negative. Adrenals/Urinary Tract: Right adrenal gland is unremarkable. Areas of nodular thickening in the left adrenal gland have absolute washouts of greater than 60%. Kidneys are unremarkable. Stomach/Bowel: Atherosclerotic calcification of the aorta. Gastrohepatic ligament lymph nodes are subcentimeter in short axis size, as are small bowel mesenteric lymph nodes. Vascular/Lymphatic: Stomach and visualized portions of the small bowel and colon are unremarkable. Other: No free fluid.  Mesenteries and peritoneum are unremarkable. Musculoskeletal: Degenerative changes in the spine. No worrisome lytic or sclerotic lesions. IMPRESSION: 1. Prevascular soft tissue nodules and subpectoral/axillary adenopathy bilaterally, findings highly worrisome for lymphoma. 2. Left adrenal nodular thickening/adenomas. No further follow-up is necessary. 3.  Aortic atherosclerosis (ICD10-I70.0). Electronically Signed   By: Lorin Picket M.D.   On: 07/08/2021 15:34   CT CORONARY MORPH W/CTA COR W/SCORE W/CA W/CM &/OR  WO/CM  Addendum Date: 06/20/2021   ADDENDUM REPORT: 06/20/2021 12:14 CLINICAL DATA:  50 year old with HTN and chest pain. EXAM: Cardiac/Coronary  CTA TECHNIQUE: The patient was scanned on a [employer]. FINDINGS: A 110 kV prospective scan was triggered in the descending thoracic aorta at 111 HU's. Axial non-contrast 3 mm slices were carried out through the heart. The data set was analyzed on a dedicated work station and scored using the [place]. Gantry rotation speed was 250 msecs and collimation was .6 mm. 0.8 mg of sl NTG was given. The 3D data set was reconstructed in 5% intervals of the 67-82 % of the R-R cycle. Diastolic phases were analyzed on a dedicated work station using MPR, MIP and VRT modes. The patient received 80 cc of contrast. Aorta: Normal size 37 mm ascending aorta. No calcifications. No dissection. Aortic Valve:  Trileaflet.  No calcifications. Coronary Arteries:  Normal coronary origin.  Right dominance. RCA is a large dominant artery that gives rise to PDA and PLA. There is small focal calcified plaque, 0-10% stenosis. Left main is a large artery that gives rise to LAD and LCX arteries. LAD is a large vessel that has scattered  calcified plaque, 0-10% stenosis. LCX is a non-dominant artery that gives rise to one large OM1 branch. There is scattered calcified plaque, 0-10% stenosis. Other findings: Normal pulmonary vein drainage into the left atrium. Normal left atrial appendage without a thrombus. Normal size of the pulmonary artery. Please see radiology report for non cardiac findings. IMPRESSION: 1. Normal coronary origin with right dominance. 2. Mild scattered calcified plaque, 0-10%, non flow limiting. (Calcium score was not performed) CAD-RADS 1. Minimal non-obstructive CAD (0-24%). Consider non-atherosclerotic causes of chest pain. Consider preventive therapy and risk factor modification. Electronically Signed   By: Candee Furbish M.D.   On: 06/20/2021 12:14   Result  Date: 06/20/2021 EXAM: OVER-READ INTERPRETATION  CT CHEST The following report is an over-read performed by radiologist Dr. Vinnie Langton of [facility], [place] on 06/20/2021. This over-read does not include interpretation of cardiac or coronary anatomy or pathology. The coronary calcium score/coronary CTA interpretation by the cardiologist is attached. COMPARISON:  None. FINDINGS: In the anterior mediastinum there is some amorphous soft tissue which is incompletely imaged, but concerning for anterior mediastinal mass. Within the visualized portions of the thorax there are no suspicious appearing pulmonary nodules or masses, there is no acute consolidative airspace disease, no pleural effusions and no pneumothorax. Visualized portions of the upper abdomen are unremarkable. There are no aggressive appearing lytic or blastic lesions noted in the visualized portions of the skeleton. IMPRESSION: 1. Anterior mediastinal mass incompletely imaged and therefore not characterized. Differential considerations include both malignant and benign etiologies. Further evaluation with contrast enhanced chest CT is recommended in the near future to better evaluate these findings. Electronically Signed: By: Vinnie Langton M.D. 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 No obvious symptoms no recent viral infection or febrile illness. No obvious symptoms suggestive of autoimmune condition.  He did have some arthralgias and myalgias but these resolved with electrolyte replacement. PLAN -Discussed available labs with the patient. -Reviewed available imaging studies including CT chest abdomen pelvis. -Patient has already had a lymph node biopsy set up with Dr. Rolm Bookbinder on Wednesday, 07/17/2021. -Will  likely need a PET CT scan depending on what the liver biopsy results show. -We will send out additional lab work-up to rule out viral etiologies and other possibilities as noted below. -We shall plan to see him back in 2 to 3 days after lymph node biopsy to discuss results.  2) HTN 3) HLD 4) OSA on CPAP 5) Smoking PLAN -mx per cardiology and PCP Orders Placed This Encounter  Procedures   CBC with Differential/Platelet    Standing Status:   Future    Number of Occurrences:   1    Standing Expiration Date:   07/09/2022   CMP ([place] only)    Standing Status:   Future    Number of Occurrences:   1    Standing Expiration Date:   07/09/2022   Lactate dehydrogenase    Standing Status:   Future    Number of Occurrences:   1    Standing Expiration Date:   07/09/2022   Magnesium    Standing Status:   Future    Number of Occurrences:   1    Standing Expiration Date:   07/09/2022   Hepatitis C antibody    Standing Status:   Future  Number of Occurrences:   1    Standing Expiration Date:   07/09/2022   Hepatitis B surface antigen    Standing Status:   Future    Number of Occurrences:   1    Standing Expiration Date:   07/09/2022   Hepatitis B core antibody, total    Standing Status:   Future    Number of Occurrences:   1    Standing Expiration Date:   07/09/2022   HIV Antibody (routine testing w rflx)    Standing Status:   Future    Number of Occurrences:   1    Standing Expiration Date:   07/09/2022   CMV abs, IgG+IgM (cytomegalovirus)    Standing Status:   Future    Number of Occurrences:   1    Standing Expiration Date:   07/09/2022   Epstein-Barr virus VCA, IgM    Standing Status:   Future    Number of Occurrences:   1    Standing Expiration Date:   07/09/2022   Epstein-Barr virus VCA, IgG    Standing Status:   Future    Number of Occurrences:   1    Standing Expiration Date:   07/09/2022   Vitamin D 25 hydroxy    Standing Status:   Future    Number of  Occurrences:   1    Standing Expiration Date:   07/09/2022   ANA, IFA (with reflex)    Standing Status:   Future    Number of Occurrences:   1    Standing Expiration Date:   07/09/2022   Flow Cytometry, Peripheral Blood (Oncology)    Atypical lymphocytes in a patient with generalized lymphadenopathy    Standing Status:   Future    Number of Occurrences:   1    Standing Expiration Date:   07/09/2022     Labs today RTC with Dr Irene Limbo 10/28 to f/u on LN biopsy results   All of the patients questions were answered with apparent satisfaction. The patient knows to call the clinic with any problems, questions or concerns.  I spent 30 minutes counseling the patient face to face. The total time spent in the appointment was 45 minutes and more than 50% was on counseling and direct patient cares.   I,Zite Okoli,acting as a Education administrator for Sullivan Lone, MD.,have documented all relevant documentation on the behalf of Sullivan Lone, MD,as directed by  Sullivan Lone, MD while in the presence of Sullivan Lone, MD.   .I have reviewed the above documentation for accuracy and completeness, and I agree with the above.   Sullivan Lone MD MS AAHIVMS [facility] Hematology/Oncology Physician [place]  07/09/2021 2:08 PM

## 2021-07-10 ENCOUNTER — Telehealth: Payer: Self-pay | Admitting: Hematology

## 2021-07-10 DIAGNOSIS — R590 Localized enlarged lymph nodes: Secondary | ICD-10-CM | POA: Diagnosis not present

## 2021-07-10 LAB — EPSTEIN-BARR VIRUS VCA, IGM: EBV VCA IgM: <36 U/mL (ref 0.0–35.9)

## 2021-07-10 LAB — HIV ANTIBODY (ROUTINE TESTING W REFLEX): HIV Screen 4th Generation wRfx: NONREACTIVE

## 2021-07-10 LAB — EPSTEIN-BARR VIRUS VCA, IGG: EBV VCA IgG: 99.1 U/mL — ABNORMAL HIGH (ref 0.0–17.9)

## 2021-07-10 LAB — HEPATITIS C ANTIBODY: HCV Ab: <0.1 s/co ratio — AB (ref 0.0–0.9)

## 2021-07-10 NOTE — Telephone Encounter (Signed)
Scheduled follow-up appointment per 10/11 los. Patient is aware. 

## 2021-07-11 LAB — SURGICAL PATHOLOGY

## 2021-07-11 LAB — CMV IGM: CMV IgM: <30 AU/mL (ref 0.0–29.9)

## 2021-07-12 LAB — ANTINUCLEAR ANTIBODIES, IFA: ANA Ab, IFA: POSITIVE — AB

## 2021-07-12 LAB — CMV ANTIBODY, IGG (EIA): CMV Ab - IgG: 7 U/mL — ABNORMAL HIGH (ref 0.00–0.59)

## 2021-07-12 LAB — FANA STAINING PATTERNS: Speckled Pattern: 24529 — ABNORMAL HIGH

## 2021-07-12 NOTE — Progress Notes (Addendum)
COVID swab appointment: n/a  COVID Vaccine Completed: yes x4 Date COVID Vaccine completed: Has received booster: COVID vaccine manufacturer: [place]   Date of COVID positive in last 90 days: no  PCP - Glori Bickers, MD Cardiologist - n/a  Chest x-ray - CT 07/05/21 Epic EKG - 06/11/21 Epic Stress Test - n/a ECHO - several years ago per pt Cardiac Cath - n/a Pacemaker/ICD device last checked: n/a Spinal Cord Stimulator: n/a  Sleep Study - yes, positive  CPAP - yes  Fasting Blood Sugar - n/a Checks Blood Sugar _____ times a day  Blood Thinner Instructions:  Aspirin Instructions:ASA 81,  Last Dose: 07/15/21  Activity level: Can go up a flight of stairs and perform activities of daily living without stopping and without symptoms of chest pain or shortness of breath.      Anesthesia review: HTN, CP (not at PAT), OSA,   Patient denies shortness of breath, fever, cough and chest pain at PAT appointment   Patient verbalized understanding of instructions that were given to them at the PAT appointment. Patient was also instructed that they will need to review over the PAT instructions again at home before surgery.

## 2021-07-12 NOTE — Patient Instructions (Addendum)
DUE TO COVID-19 ONLY ONE VISITOR IS ALLOWED TO COME WITH YOU AND STAY IN THE WAITING ROOM ONLY DURING PRE OP AND PROCEDURE.   **NO VISITORS ARE ALLOWED IN THE SHORT STAY AREA OR RECOVERY ROOM!!**       Your procedure is scheduled on: 07/17/21   Report to [facility] Main Entrance    Report to admitting at 6:45 AM   Call this number if you have problems the morning of surgery [phone]   Do not eat food :After Midnight.   May have liquids until 6:00 am day of surgery  CLEAR LIQUID DIET  Foods Allowed                                                                     Foods Excluded  Water, Black Coffee and tea (no milk or creamer)           liquids that you cannot  Plain Jell-O in any flavor  (No red)                                    see through such as: Fruit ices (not with fruit pulp)                                            milk, soups, orange juice              Iced Popsicles (No red)                                                All solid food                                   Apple juices Sports drinks like Gatorade (No red) Lightly seasoned clear broth or consume(fat free) Sugar      The day of surgery:  Drink ONE (1) Pre-Surgery Clear Ensure by 6:00 am the morning of surgery. Drink in one sitting. Do not sip.  This drink was given to you during your hospital  pre-op appointment visit. Nothing else to drink after completing the  Pre-Surgery Clear Ensure.          If you have questions, please contact your surgeon's office.     Oral Hygiene is also important to reduce your risk of infection.                                    Remember - BRUSH YOUR TEETH THE MORNING OF SURGERY WITH YOUR REGULAR TOOTHPASTE   Do NOT smoke after Midnight   Take these medicines the morning of surgery with A SIP OF WATER: Carvedilol, Pepcid, Crestor  You may not have any metal on your body including jewelry, and body piercing             Do  not wear lotions, powders, cologne, or deodorant              Men may shave face and neck.   Do not bring valuables to the hospital. [place].   Contacts, dentures or bridgework may not be worn into surgery.    Patients discharged on the day of surgery will not be allowed to drive home.   Please read over the following fact sheets you were given: IF YOU HAVE QUESTIONS ABOUT YOUR PRE-OP INSTRUCTIONS PLEASE CALL [place] - Preparing for Surgery Before surgery, you can play an important role.  Because skin is not sterile, your skin needs to be as free of germs as possible.  You can reduce the number of germs on your skin by washing with CHG (chlorahexidine gluconate) soap before surgery.  CHG is an antiseptic cleaner which kills germs and bonds with the skin to continue killing germs even after washing. Please DO NOT use if you have an allergy to CHG or antibacterial soaps.  If your skin becomes reddened/irritated stop using the CHG and inform your nurse when you arrive at Short Stay. Do not shave (including legs and underarms) for at least 48 hours prior to the first CHG shower.  You may shave your face/neck.  Please follow these instructions carefully:  1.  Shower with CHG Soap the night before surgery and the  morning of surgery.  2.  If you choose to wash your hair, wash your hair first as usual with your normal  shampoo.  3.  After you shampoo, rinse your hair and body thoroughly to remove the shampoo.                             4.  Use CHG as you would any other liquid soap.  You can apply chg directly to the skin and wash.  Gently with a scrungie or clean washcloth.  5.  Apply the CHG Soap to your body ONLY FROM THE NECK DOWN.   Do   not use on face/ open                           Wound or open sores. Avoid contact with eyes, ears mouth and   genitals (private parts).                       Wash face,  Genitals  (private parts) with your normal soap.             6.  Wash thoroughly, paying special attention to the area where your    surgery  will be performed.  7.  Thoroughly rinse your body with warm water from the neck down.  8.  DO NOT shower/wash with your normal soap after using and rinsing off the CHG Soap.                9.  Pat yourself dry with a clean towel.            10.  Wear clean pajamas.  11.  Place clean sheets on your bed the night of your first shower and do not  sleep with pets. Day of Surgery : Do not apply any lotions/deodorants the morning of surgery.  Please wear clean clothes to the hospital/surgery center.  FAILURE TO FOLLOW THESE INSTRUCTIONS MAY RESULT IN THE CANCELLATION OF YOUR SURGERY  PATIENT SIGNATURE_________________________________  NURSE SIGNATURE__________________________________  ________________________________________________________________________

## 2021-07-12 NOTE — Progress Notes (Signed)
Please place orders for PAT appointment scheduled 07/15/21.

## 2021-07-15 ENCOUNTER — Encounter (HOSPITAL_COMMUNITY)
Admission: RE | Admit: 2021-07-15 | Discharge: 2021-07-15 | Disposition: A | Discharge status: Home / Self Care | Payer: 59 | Source: Ambulatory Visit | Attending: General Surgery | Admitting: General Surgery

## 2021-07-15 ENCOUNTER — Other Ambulatory Visit: Payer: Self-pay

## 2021-07-15 ENCOUNTER — Other Ambulatory Visit: Payer: Self-pay | Admitting: General Surgery

## 2021-07-15 ENCOUNTER — Encounter (HOSPITAL_COMMUNITY): Payer: Self-pay

## 2021-07-15 DIAGNOSIS — Z01812 Encounter for preprocedural laboratory examination: Secondary | ICD-10-CM | POA: Insufficient documentation

## 2021-07-15 HISTORY — DX: Essential (primary) hypertension: I10

## 2021-07-15 HISTORY — DX: Pneumonia, unspecified organism: J18.9

## 2021-07-15 HISTORY — DX: Sleep apnea, unspecified: G47.30

## 2021-07-15 HISTORY — DX: Pure hypercholesterolemia, unspecified: E78.00

## 2021-07-15 LAB — POTASSIUM: Potassium: 3.6 mmol/L (ref 3.5–5.1)

## 2021-07-16 ENCOUNTER — Other Ambulatory Visit (HOSPITAL_COMMUNITY): Payer: 59

## 2021-07-16 LAB — FLOW CYTOMETRY

## 2021-07-16 NOTE — Progress Notes (Signed)
Anesthesia Chart Review   Case: [id number] Date/Time: 07/17/21 0845   Procedure: LEFT AXILLARY LYMPH NODE BIOPSY (Left)   Anesthesia type: General   Pre-op diagnosis: LEFT AXILLARY AND GENERALIZED ADENOPATHY   Location: [facility] ROOM 04 / [facility]   Surgeons: Rolm Bookbinder, MD       DISCUSSION:50 y.o. smoker with h/o HERD, HTN, [name], left axillary adenopathy scheduled for above procedure 07/17/2021 with Dr. Rolm Bookbinder.   Pt seen by cardiology 06/11/2021. Coronary CT 06/20/21 with minimal non-obstructive CAD. Referred to oncology due to mediastinal mass.   Anticipate pt can proceed with planned procedure barring acute status change.   VS: BP (!) 157/94   Pulse 71   Temp 37.1 C (Oral)   Resp 14   Ht 6\' 2"  (1.88 m)   Wt 122.8 kg   SpO2 100%   BMI 34.77 kg/m   PROVIDERS: Bensimhon, [name], MD is PCP   Glori Bickers, MD is Cardiologist  LABS: Labs reviewed: Acceptable for surgery. (all labs ordered are listed, but only abnormal results are displayed)  Labs Reviewed  POTASSIUM     IMAGES:   EKG: 06/11/2021 Rate 70 bpm  NSR Non-specific intra-ventricular conduction delay  CV:  Past Medical History:  Diagnosis Date   GERD (gastroesophageal reflux disease)    Hypercholesteremia    Hypertension    Pneumonia    Sleep apnea     History reviewed. No pertinent surgical history.  MEDICATIONS:  amLODipine-benazepril (LOTREL) 10-40 MG capsule   carvedilol (COREG) 6.25 MG tablet   eplerenone (INSPRA) 50 MG tablet   famotidine (PEPCID) 20 MG tablet   hydrALAZINE (APRESOLINE) 10 MG tablet   Magnesium Oxide 200 MG TABS   omeprazole (PRILOSEC) 20 MG capsule   potassium chloride (KLOR-CON) 10 MEQ tablet   rosuvastatin (CRESTOR) 10 MG tablet   No current facility-administered medications for this encounter.   Konrad Felix Ward, PA-C [facility] [phone]

## 2021-07-16 NOTE — Anesthesia Preprocedure Evaluation (Addendum)
Anesthesia Evaluation  Patient identified by MRN, date of birth, ID band Patient awake    Reviewed: Allergy & Precautions, NPO status , Patient's Chart, lab work & pertinent test results  Airway Mallampati: II  TM Distance: >3 FB Neck ROM: Full    Dental  (+) Teeth Intact   Pulmonary sleep apnea , Current Smoker and Patient abstained from smoking.,    Pulmonary exam normal        Cardiovascular hypertension, Pt. on medications and Pt. on home beta blockers  Rhythm:Regular Rate:Normal     Neuro/Psych negative neurological ROS  negative psych ROS   GI/Hepatic Neg liver ROS, GERD  Medicated,  Endo/Other  negative endocrine ROS  Renal/GU negative Renal ROS  negative genitourinary   Musculoskeletal Axillary lymphadenopathy   Abdominal (+)  Abdomen: soft.    Peds  Hematology negative hematology ROS (+)   Anesthesia Other Findings   Reproductive/Obstetrics                            Anesthesia Physical Anesthesia Plan  ASA: 2  Anesthesia Plan: General   Post-op Pain Management:    Induction: Intravenous  PONV Risk Score and Plan: 1 and Ondansetron, Dexamethasone, Midazolam and Treatment may vary due to age or medical condition  Airway Management Planned: Mask and LMA  Additional Equipment: None  Intra-op Plan:   Post-operative Plan: Extubation in OR  Informed Consent: I have reviewed the patients History and Physical, chart, labs and discussed the procedure including the risks, benefits and alternatives for the proposed anesthesia with the patient or authorized representative who has indicated his/her understanding and acceptance.     Dental advisory given  Plan Discussed with: CRNA  Anesthesia Plan Comments: (Lab Results      Component                Value               Date                      WBC                      4.1                 07/09/2021                HGB                       13.3                07/09/2021                HCT                      40.6                07/09/2021                MCV                      79.1 (L)            07/09/2021                PLT  210                 07/09/2021           Lab Results      Component                Value               Date                      NA                       137                 07/09/2021                K                        3.6                 07/15/2021                CO2                      22                  07/09/2021                GLUCOSE                  103 (H)             07/09/2021                BUN                      12                  07/09/2021                CREATININE               1.24                07/09/2021                CALCIUM                  9.3                 07/09/2021                GFRNONAA                 >60                 07/09/2021          )       Anesthesia Quick Evaluation

## 2021-07-17 ENCOUNTER — Ambulatory Visit (HOSPITAL_COMMUNITY): Payer: 59 | Admitting: Physician Assistant

## 2021-07-17 ENCOUNTER — Encounter (HOSPITAL_COMMUNITY)
Admission: RE | Disposition: A | Discharge status: Home / Self Care | Payer: Self-pay | Source: Ambulatory Visit | Attending: General Surgery

## 2021-07-17 ENCOUNTER — Encounter (HOSPITAL_COMMUNITY): Payer: Self-pay | Admitting: General Surgery

## 2021-07-17 ENCOUNTER — Ambulatory Visit (HOSPITAL_COMMUNITY): Payer: 59 | Admitting: Anesthesiology

## 2021-07-17 ENCOUNTER — Other Ambulatory Visit: Payer: Self-pay

## 2021-07-17 ENCOUNTER — Other Ambulatory Visit (HOSPITAL_COMMUNITY): Payer: Self-pay

## 2021-07-17 ENCOUNTER — Ambulatory Visit (HOSPITAL_COMMUNITY)
Admission: RE | Admit: 2021-07-17 | Discharge: 2021-07-17 | Disposition: A | Discharge status: Home / Self Care | Payer: 59 | Source: Ambulatory Visit | Attending: General Surgery | Admitting: General Surgery

## 2021-07-17 DIAGNOSIS — Z79899 Other long term (current) drug therapy: Secondary | ICD-10-CM | POA: Diagnosis not present

## 2021-07-17 DIAGNOSIS — I10 Essential (primary) hypertension: Secondary | ICD-10-CM | POA: Diagnosis not present

## 2021-07-17 DIAGNOSIS — R591 Generalized enlarged lymph nodes: Secondary | ICD-10-CM | POA: Diagnosis not present

## 2021-07-17 DIAGNOSIS — Z888 Allergy status to other drugs, medicaments and biological substances status: Secondary | ICD-10-CM | POA: Insufficient documentation

## 2021-07-17 DIAGNOSIS — K219 Gastro-esophageal reflux disease without esophagitis: Secondary | ICD-10-CM | POA: Diagnosis not present

## 2021-07-17 DIAGNOSIS — F17200 Nicotine dependence, unspecified, uncomplicated: Secondary | ICD-10-CM | POA: Insufficient documentation

## 2021-07-17 DIAGNOSIS — R590 Localized enlarged lymph nodes: Secondary | ICD-10-CM | POA: Diagnosis not present

## 2021-07-17 DIAGNOSIS — E785 Hyperlipidemia, unspecified: Secondary | ICD-10-CM | POA: Diagnosis not present

## 2021-07-17 HISTORY — PX: AXILLARY LYMPH NODE BIOPSY: SHX5737

## 2021-07-17 SURGERY — AXILLARY LYMPH NODE BIOPSY
Anesthesia: General | Site: Axilla | Laterality: Left

## 2021-07-17 MED ORDER — FENTANYL CITRATE (PF) 100 MCG/2ML IJ SOLN
INTRAMUSCULAR | Status: AC
  Filled 2021-07-17: qty 2

## 2021-07-17 MED ORDER — FENTANYL CITRATE (PF) 100 MCG/2ML IJ SOLN
INTRAMUSCULAR | Status: DC | PRN
  Administered 2021-07-17 (×3): 25 ug via INTRAVENOUS

## 2021-07-17 MED ORDER — ACETAMINOPHEN 500 MG PO TABS
1000.0000 mg | ORAL_TABLET | ORAL | Status: AC
  Administered 2021-07-17: 1000 mg via ORAL
  Filled 2021-07-17: qty 2

## 2021-07-17 MED ORDER — TRAMADOL HCL 50 MG PO TABS
100.0000 mg | ORAL_TABLET | Freq: Four times a day (QID) | ORAL | 0 refills | Status: DC | PRN
  Filled 2021-07-17: qty 8, 1d supply, fill #0

## 2021-07-17 MED ORDER — ENSURE PRE-SURGERY PO LIQD
296.0000 mL | Freq: Once | ORAL | Status: DC
  Filled 2021-07-17: qty 296

## 2021-07-17 MED ORDER — MIDAZOLAM HCL 2 MG/2ML IJ SOLN
INTRAMUSCULAR | Status: DC | PRN
  Administered 2021-07-17: 2 mg via INTRAVENOUS

## 2021-07-17 MED ORDER — MIDAZOLAM HCL 2 MG/2ML IJ SOLN
INTRAMUSCULAR | Status: AC
  Filled 2021-07-17: qty 2

## 2021-07-17 MED ORDER — FENTANYL CITRATE PF 50 MCG/ML IJ SOSY: 25.0000 ug | PREFILLED_SYRINGE | INTRAMUSCULAR | Status: DC | PRN

## 2021-07-17 MED ORDER — CEFAZOLIN IN SODIUM CHLORIDE 3-0.9 GM/100ML-% IV SOLN
3.0000 g | INTRAVENOUS | Status: AC
  Administered 2021-07-17: 3 g via INTRAVENOUS
  Filled 2021-07-17: qty 100

## 2021-07-17 MED ORDER — DEXAMETHASONE SODIUM PHOSPHATE 10 MG/ML IJ SOLN
INTRAMUSCULAR | Status: DC | PRN
  Administered 2021-07-17: 4 mg via INTRAVENOUS

## 2021-07-17 MED ORDER — CEFAZOLIN SODIUM-DEXTROSE 2-4 GM/100ML-% IV SOLN: 2.0000 g | INTRAVENOUS | Status: DC

## 2021-07-17 MED ORDER — HEMOSTATIC AGENTS (NO CHARGE) OPTIME
TOPICAL | Status: DC | PRN
  Administered 2021-07-17: 1 via TOPICAL

## 2021-07-17 MED ORDER — DEXAMETHASONE SODIUM PHOSPHATE 10 MG/ML IJ SOLN
INTRAMUSCULAR | Status: AC
  Filled 2021-07-17: qty 1

## 2021-07-17 MED ORDER — PROPOFOL 10 MG/ML IV BOLUS
INTRAVENOUS | Status: DC | PRN
  Administered 2021-07-17: 200 mg via INTRAVENOUS

## 2021-07-17 MED ORDER — PROPOFOL 10 MG/ML IV BOLUS
INTRAVENOUS | Status: AC
  Filled 2021-07-17: qty 20

## 2021-07-17 MED ORDER — 0.9 % SODIUM CHLORIDE (POUR BTL) OPTIME
TOPICAL | Status: DC | PRN
  Administered 2021-07-17: 1000 mL

## 2021-07-17 MED ORDER — LACTATED RINGERS IV SOLN: INTRAVENOUS | Status: DC

## 2021-07-17 MED ORDER — CHLORHEXIDINE GLUCONATE 0.12 % MT SOLN
15.0000 mL | Freq: Once | OROMUCOSAL | Status: AC
  Administered 2021-07-17: 15 mL via OROMUCOSAL

## 2021-07-17 MED ORDER — ORAL CARE MOUTH RINSE: 15.0000 mL | Freq: Once | OROMUCOSAL | Status: AC

## 2021-07-17 MED ORDER — BUPIVACAINE-EPINEPHRINE (PF) 0.25% -1:200000 IJ SOLN
INTRAMUSCULAR | Status: AC
  Filled 2021-07-17: qty 30

## 2021-07-17 MED ORDER — LIDOCAINE HCL (PF) 2 % IJ SOLN
INTRAMUSCULAR | Status: AC
  Filled 2021-07-17: qty 5

## 2021-07-17 MED ORDER — CHLORHEXIDINE GLUCONATE CLOTH 2 % EX PADS: 6.0000 | MEDICATED_PAD | Freq: Once | CUTANEOUS | Status: DC

## 2021-07-17 MED ORDER — ONDANSETRON HCL 4 MG/2ML IJ SOLN
INTRAMUSCULAR | Status: DC | PRN
  Administered 2021-07-17: 4 mg via INTRAVENOUS

## 2021-07-17 MED ORDER — LIDOCAINE 2% (20 MG/ML) 5 ML SYRINGE
INTRAMUSCULAR | Status: DC | PRN
  Administered 2021-07-17: 80 mg via INTRAVENOUS

## 2021-07-17 MED ORDER — BUPIVACAINE-EPINEPHRINE 0.25% -1:200000 IJ SOLN
INTRAMUSCULAR | Status: DC | PRN
  Administered 2021-07-17: 4 mL

## 2021-07-17 MED ORDER — ONDANSETRON HCL 4 MG/2ML IJ SOLN
INTRAMUSCULAR | Status: AC
  Filled 2021-07-17: qty 2

## 2021-07-17 SURGICAL SUPPLY — 42 items
ADH SKN CLS APL DERMABOND .7 (GAUZE/BANDAGES/DRESSINGS) ×1
APL SKNCLS STERI-STRIP NONHPOA (GAUZE/BANDAGES/DRESSINGS) ×1
APPLIER CLIP 9.375 MED OPEN (MISCELLANEOUS) ×2
APR CLP MED 9.3 20 MLT OPN (MISCELLANEOUS) ×1
BAG COUNTER SPONGE SURGICOUNT (BAG) IMPLANT
BAG SPNG CNTER NS LX DISP (BAG)
BENZOIN TINCTURE PRP APPL 2/3 (GAUZE/BANDAGES/DRESSINGS) ×2 IMPLANT
BLADE HEX COATED 2.75 (ELECTRODE) ×2 IMPLANT
BLADE SURG 15 STRL LF DISP TIS (BLADE) ×3 IMPLANT
BLADE SURG 15 STRL SS (BLADE) ×2
BNDG ELASTIC 6X5.8 VLCR STR LF (GAUZE/BANDAGES/DRESSINGS) IMPLANT
CLIP APPLIE 9.375 MED OPEN (MISCELLANEOUS) ×1 IMPLANT
COVER SURGICAL LIGHT HANDLE (MISCELLANEOUS) ×2 IMPLANT
DECANTER SPIKE VIAL GLASS SM (MISCELLANEOUS) ×2 IMPLANT
DERMABOND ADVANCED (GAUZE/BANDAGES/DRESSINGS) ×1
DERMABOND ADVANCED .7 DNX12 (GAUZE/BANDAGES/DRESSINGS) IMPLANT
DRAPE LAPAROSCOPIC ABDOMINAL (DRAPES) ×2 IMPLANT
DRSG TEGADERM 4X4.75 (GAUZE/BANDAGES/DRESSINGS) ×2 IMPLANT
ELECT REM PT RETURN 15FT ADLT (MISCELLANEOUS) ×2 IMPLANT
GAUZE 4X4 16PLY ~~LOC~~+RFID DBL (SPONGE) ×2 IMPLANT
GAUZE SPONGE 4X4 12PLY STRL (GAUZE/BANDAGES/DRESSINGS) IMPLANT
GLOVE SURG ENC MOIS LTX SZ7 (GLOVE) ×2 IMPLANT
GLOVE SURG UNDER POLY LF SZ7.5 (GLOVE) ×3 IMPLANT
GOWN STRL REUS W/TWL LRG LVL3 (GOWN DISPOSABLE) ×4 IMPLANT
HEMOSTAT ARISTA ABSORB 3G PWDR (HEMOSTASIS) ×2 IMPLANT
KIT BASIN OR (CUSTOM PROCEDURE TRAY) ×2 IMPLANT
MARKER SKIN DUAL TIP RULER LAB (MISCELLANEOUS) ×2 IMPLANT
NDL HYPO 25X1 1.5 SAFETY (NEEDLE) ×1 IMPLANT
NEEDLE HYPO 25X1 1.5 SAFETY (NEEDLE) ×2 IMPLANT
NS IRRIG 1000ML POUR BTL (IV SOLUTION) ×2 IMPLANT
PACK BASIC VI WITH GOWN DISP (CUSTOM PROCEDURE TRAY) ×2 IMPLANT
PENCIL SMOKE EVACUATOR (MISCELLANEOUS) ×1 IMPLANT
STRIP CLOSURE SKIN 1/2X4 (GAUZE/BANDAGES/DRESSINGS) ×2 IMPLANT
SUT MNCRL AB 4-0 PS2 18 (SUTURE) ×2 IMPLANT
SUT VIC AB 2-0 SH 27 (SUTURE) ×2
SUT VIC AB 2-0 SH 27X BRD (SUTURE) IMPLANT
SUT VIC AB 3-0 SH 27 (SUTURE) ×2
SUT VIC AB 3-0 SH 27XBRD (SUTURE) ×1 IMPLANT
SYR BULB IRRIG 60ML STRL (SYRINGE) IMPLANT
SYR CONTROL 10ML LL (SYRINGE) ×2 IMPLANT
TOWEL OR 17X26 10 PK STRL BLUE (TOWEL DISPOSABLE) ×2 IMPLANT
TOWEL OR NON WOVEN STRL DISP B (DISPOSABLE) ×2 IMPLANT

## 2021-07-17 NOTE — Discharge Instructions (Signed)
[facility] Office Phone Number [phone]   POST OP INSTRUCTIONS Take 400 mg of ibuprofen every 8 hours or 650 mg tylenol every 6 hours for next 72 hours then as needed. Use ice several times daily also. Always review your discharge instruction sheet given to you by the facility where your surgery was performed.  IF YOU HAVE DISABILITY OR FAMILY LEAVE FORMS, YOU MUST BRING THEM TO THE OFFICE FOR PROCESSING.  DO NOT GIVE THEM TO YOUR DOCTOR.  A prescription for pain medication may be given to you upon discharge.  Take your pain medication as prescribed, if needed.  If narcotic pain medicine is not needed, then you may take acetaminophen (Tylenol), naprosyn (Alleve) or ibuprofen (Advil) as needed. Take your usually prescribed medications unless otherwise directed If you need a refill on your pain medication, please contact your pharmacy.  They will contact our office to request authorization.  Prescriptions will not be filled after 5pm or on week-ends. You should eat very light the first 24 hours after surgery, such as soup, crackers, pudding, etc.  Resume your normal diet the day after surgery. Most patients will experience some swelling and bruising.  Swelling and bruising can take several days to resolve.  It is common to experience some constipation if taking pain medication after surgery.  Increasing fluid intake and taking a stool softener will usually help or prevent this problem from occurring.  A mild laxative (Milk of Magnesia or Miralax) should be taken according to package directions if there are no bowel movements after 48 hours. Unless discharge instructions indicate otherwise, you may remove your bandages 48 hours after surgery and you may shower at that time.  You may have steri-strips (small skin tapes) in place directly over the incision.  These strips should be left on the skin for 7-10 days and will come off on their own.  If your surgeon used skin glue on the  incision, you may shower in 24 hours.  The glue will flake off over the next 2-3 weeks.  Any sutures or staples will be removed at the office during your follow-up visit. ACTIVITIES:  You may resume regular daily activities (gradually increasing) beginning the next day.  Wearing a good support bra or sports bra minimizes pain and swelling.  You may have sexual intercourse when it is comfortable. You may drive when you no longer are taking prescription pain medication, you can comfortably wear a seatbelt, and you can safely maneuver your car and apply brakes. RETURN TO WORK:  ______________________________________________________________________________________ Dennis Bast should see your doctor in the office for a follow-up appointment approximately two weeks after your surgery.  Your doctor's nurse will typically make your follow-up appointment when she calls you with your pathology report.  Expect your pathology report 3-4 business days after your surgery.  You may call to check if you do not hear from [place] after three days. OTHER INSTRUCTIONS: _______________________________________________________________________________________________ _____________________________________________________________________________________________________________________________________ _____________________________________________________________________________________________________________________________________ _____________________________________________________________________________________________________________________________________  WHEN TO CALL DR Aislin Onofre: Fever over 101.0 Nausea and/or vomiting. Extreme swelling or bruising. Continued bleeding from incision. Increased pain, redness, or drainage from the incision.  The clinic staff is available to answer your questions during regular business hours.  Please don't hesitate to call and ask to speak to one of the nurses for clinical concerns.  If you have a  medical emergency, go to the nearest emergency room or call 911.  A surgeon from [facility] is always on call at the hospital.  For further questions, please  visit [email or web address]

## 2021-07-17 NOTE — Anesthesia Procedure Notes (Signed)
Procedure Name: LMA Insertion Date/Time: 07/17/2021 9:00 AM Performed by: Niel Hummer, CRNA Pre-anesthesia Checklist: Patient identified, Emergency Drugs available, Suction available and Patient being monitored Patient Re-evaluated:Patient Re-evaluated prior to induction Oxygen Delivery Method: Circle system utilized Preoxygenation: Pre-oxygenation with 100% oxygen Induction Type: IV induction LMA: LMA inserted LMA Size: 5.0 Number of attempts: 1 Dental Injury: Teeth and Oropharynx as per pre-operative assessment

## 2021-07-17 NOTE — H&P (Signed)
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 He was undergoing a cardiac evaluation with Dr.Bensimhon. He was noted to have some very mild scattered plaque but otherwise no real evidence of coronary artery disease. Radiology noted an anterior mediastinal mass that was incompletely imaged at that time and then he underwent a CT chest abdomen pelvis. The CT abdomen pelvis was due to concern for possible hyperaldosteronism given his laboratory evaluation. CT of his chest showed prevascular soft tissue nodules as well as subpectoral and axillary adenopathy bilaterally with the left greater than the right side. There are numerous axillary nodes bilaterally measuring up to about 1.4 cm on the left side. He has no B symptoms related to this. He is otherwise doing well. He did note several weeks ago a left axillary mass that was tender. This was treated with warm compresses and has resolved now. He has been seen by heme-onc yesterday and comes in today to discuss an excisional biopsy as there is a concern for a lymphoma.  A complete review of systems was obtained from the patient. I have reviewed this information and discussed as appropriate with the patient. See HPI as well for other ROS.  Review of Systems  All other systems reviewed and are negative.   Medical History: Past Medical History:  Diagnosis Date   GERD (gastroesophageal reflux disease)   Hyperlipidemia   Hypertension   Patient Active Problem List  Diagnosis   HTN (hypertension)   Hyperlipidemia   OSA (obstructive sleep apnea)   History reviewed. No pertinent surgical history.   Allergies  Allergen Reactions   Chloroquine Other (See Comments)  unknown   Current Outpatient Medications on File Prior to Visit  Medication Sig Dispense Refill   carvediloL (COREG) 6.25 MG tablet Take 1 tablet by mouth 2 (two) times daily   amLODIPine-benazepril (LOTREL) 10-40 mg capsule   hydrALAZINE (APRESOLINE) 10 MG  tablet   rosuvastatin (CRESTOR) 10 MG tablet   No current facility-administered medications on file prior to visit.   Family History  Problem Relation Age of Onset   High blood pressure (Hypertension) Mother   Hyperlipidemia (Elevated cholesterol) Mother   Heart valve disease Mother   Stroke Father    Social History   Tobacco Use  Smoking Status Light Tobacco Smoker  Smokeless Tobacco Never Used    Social History   Socioeconomic History   Marital status: Unknown  Tobacco Use   Smoking status: Light Tobacco Smoker   Smokeless tobacco: Never Used  Vaping Use   Vaping Use: Never used  Substance and Sexual Activity   Alcohol use: Never   Drug use: Never   Objective:   Vitals:  07/10/21 1626  BP: (!) 158/82  Pulse: 88  Temp: 36.8 C (98.3 F)  SpO2: 100%  Weight: (!) 122.7 kg (270 lb 6.4 oz)  Height: 188 cm (6\' 2" )   Body mass index is 34.72 kg/m.  Physical Exam Constitutional:  Appearance: Normal appearance.  Chest:  Comments: No right axillary adenopathy, ? Left axillary shoddy adenopathy No cervical or [place] adenopathy Neurological:  Mental Status: He is alert.     Assessment and Plan:   Axillary adenopathy  Left axillary node excisional biopsy  I think regardless of what his labs show due to the numerous areas of adenopathy the only way to figure this out is going to be an excisional biopsy. We discussed the role of IR biopsy but I think more tissue is going to be needed. I discussed a  left axillary lymph node biopsy as an outpatient. We discussed risks including bleeding, infection as well as seroma requiring drainage postoperatively. I am going to try to proceed next week.

## 2021-07-17 NOTE — Op Note (Signed)
Preoperative diagnosis generalized lymphadenopathy Postoperative diagnosis: Same as above Procedure: Left axillary lymph node biopsy Surgeon: Dr. Serita Grammes Anesthesia: General Estimated blood loss: Minimal Complications:none Drains:none Specimens: Axillary lymph nodes to pathology Sponge needle count was correct completion Disposition to recovery stable condition  Indications: This is a 50 year old male who was noted to have adenopathy after undergoing a cardiac screening.  We discussed proceeding to the operating room for a left axillary lymph node biopsy as these were the most prominent on his scans to rule out a lymphoma.  Procedure: After informed consent was obtained patient was taken the operating.  He was given antibiotics.  SCDs were in place.  He was placed under general anesthesia without complication.  He was prepped and draped in the standard sterile surgical fashion.  Surgical timeout was then performed.  I filtrated Marcaine and then made an incision below the axillary hairline.  I carried this through the axillary fascia and entered in the axilla.  He immediately had 2 lymph nodes that were visible.  I then excised these using clips to clip the draining lymphatics as well as some small blood vessels.  These were then passed off the table fresh.  Hemostasis was observed.  I then closed the axillary fascia with 2-0 Vicryl.  The skin was closed with 3-0 Vicryl for Monocryl.  Glue Steri-Strips were applied.  He tolerated this well was extubated transferred recovery stable.

## 2021-07-17 NOTE — Interval H&P Note (Signed)
History and Physical Interval Note:  07/17/2021 7:34 AM  Mark Nichols  has presented today for surgery, with the diagnosis of LEFT AXILLARY AND GENERALIZED ADENOPATHY.  The various methods of treatment have been discussed with the patient and family. After consideration of risks, benefits and other options for treatment, the patient has consented to  Procedure(s): LEFT AXILLARY LYMPH NODE BIOPSY (Left) as a surgical intervention.  The patient's history has been reviewed, patient examined, no change in status, stable for surgery.  I have reviewed the patient's chart and labs.  Questions were answered to the patient's satisfaction.     Rolm Bookbinder

## 2021-07-17 NOTE — Transfer of Care (Signed)
Immediate Anesthesia Transfer of Care Note  Patient: Mark Nichols  Procedure(s) Performed: LEFT AXILLARY LYMPH NODE BIOPSY (Left: Axilla)  Patient Location: PACU  Anesthesia Type:General  Level of Consciousness: drowsy  Airway & Oxygen Therapy: Patient Spontanous Breathing and Patient connected to face mask oxygen  Post-op Assessment: Report given to RN, Post -op Vital signs reviewed and stable and Patient moving all extremities X 4  Post vital signs: Reviewed and stable  Last Vitals:  Vitals Value Taken Time  BP 113/74   Temp    Pulse 46   Resp    SpO2 100     Last Pain:  Vitals:   07/17/21 0743  TempSrc: Oral  PainSc:          Complications: No notable events documented.

## 2021-07-17 NOTE — Anesthesia Postprocedure Evaluation (Signed)
Anesthesia Post Note  Patient: Mark Nichols  Procedure(s) Performed: LEFT AXILLARY LYMPH NODE BIOPSY (Left: Axilla)     Patient location during evaluation: PACU Anesthesia Type: General Level of consciousness: awake and alert Pain management: pain level controlled Vital Signs Assessment: post-procedure vital signs reviewed and stable Respiratory status: spontaneous breathing, nonlabored ventilation, respiratory function stable and patient connected to nasal cannula oxygen Cardiovascular status: blood pressure returned to baseline and stable Postop Assessment: no apparent nausea or vomiting Anesthetic complications: no   No notable events documented.  Last Vitals:  Vitals:   07/17/21 1025 07/17/21 1030  BP:  128/87  Pulse: (!) 43 (!) 50  Resp: 18 16  Temp:    SpO2: 100% 98%    Last Pain:  Vitals:   07/17/21 1030  TempSrc:   PainSc: 0-No pain                 Belenda Cruise P Tashaun Obey

## 2021-07-18 ENCOUNTER — Encounter (HOSPITAL_COMMUNITY): Payer: Self-pay | Admitting: General Surgery

## 2021-07-19 ENCOUNTER — Other Ambulatory Visit (HOSPITAL_COMMUNITY): Payer: Self-pay | Admitting: *Deleted

## 2021-07-19 ENCOUNTER — Other Ambulatory Visit (HOSPITAL_COMMUNITY): Payer: Self-pay

## 2021-07-19 MED ORDER — HYDRALAZINE HCL 50 MG PO TABS
50.0000 mg | ORAL_TABLET | Freq: Two times a day (BID) | ORAL | 6 refills | Status: DC
  Filled 2021-07-19: qty 60, 30d supply, fill #0
  Filled 2021-08-20: qty 60, 30d supply, fill #1
  Filled 2021-09-25: qty 60, 30d supply, fill #2
  Filled 2021-11-02: qty 60, 30d supply, fill #3
  Filled 2021-12-04: qty 60, 30d supply, fill #4
  Filled 2022-01-07: qty 60, 30d supply, fill #5
  Filled 2022-02-07: qty 32, 16d supply, fill #6
  Filled 2022-02-07: qty 28, 14d supply, fill #6

## 2021-07-19 MED ORDER — CARVEDILOL 6.25 MG PO TABS
ORAL_TABLET | ORAL | 6 refills | Status: DC
  Filled 2021-07-19: qty 60, 30d supply, fill #0
  Filled 2021-08-20: qty 60, 30d supply, fill #1
  Filled 2021-09-25: qty 60, 30d supply, fill #2
  Filled 2021-11-02: qty 60, 30d supply, fill #3
  Filled 2021-12-04: qty 60, 30d supply, fill #4
  Filled 2022-01-07: qty 60, 30d supply, fill #5
  Filled 2022-02-07: qty 60, 30d supply, fill #6

## 2021-07-23 LAB — SURGICAL PATHOLOGY

## 2021-07-24 ENCOUNTER — Inpatient Hospital Stay (HOSPITAL_BASED_OUTPATIENT_CLINIC_OR_DEPARTMENT_OTHER): Payer: 59 | Admitting: Hematology

## 2021-07-24 DIAGNOSIS — Z79899 Other long term (current) drug therapy: Secondary | ICD-10-CM | POA: Diagnosis not present

## 2021-07-24 DIAGNOSIS — I10 Essential (primary) hypertension: Secondary | ICD-10-CM | POA: Diagnosis not present

## 2021-07-24 DIAGNOSIS — R591 Generalized enlarged lymph nodes: Secondary | ICD-10-CM

## 2021-07-24 DIAGNOSIS — F17210 Nicotine dependence, cigarettes, uncomplicated: Secondary | ICD-10-CM | POA: Diagnosis not present

## 2021-07-25 ENCOUNTER — Telehealth: Payer: Self-pay | Admitting: Hematology

## 2021-07-25 NOTE — Telephone Encounter (Signed)
Scheduled follow-up appointments per 10/26 los. Patient is aware. 

## 2021-07-30 NOTE — Progress Notes (Addendum)
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  Given this cardiac risk factors he did have a CT coronary angiogram on 06/20/2021 which showed no flow-limiting lesions.  Incidentally noted to have an anterior mediastinal mass. He subsequently had a CT chest abdomen pelvis on 07/05/2021 which showed possible lymphadenopathy/enlarged thymus in the anterior mediastinum as well as numerous axillary and subpectoral lymph nodes bilaterally. He was noted to have nodular thickening of his left adrenal gland representing possible adenoma.  Patient notes that he was having some joint and muscle pains.  He was noted to be significantly hypokalemic, [name], [name],  chills, unexpected weight loss and night sweats. He started walking for 4 to 5 miles after his lab work last month. No recent travel outside [place] He reports being exposed to blood from a patient who was Covid-19 positive and HIV positive but he was in full protective gear.  He reports that he has talked to Dr. Serita Nichols and has a lymph node biopsy scheduled next week 11-15-2022.  His mother died of liver disease. There is no family history of auto-immune diseases. He has never required blood transfusions.  He smokes cigarettes socially.  INTERVAL HISTORY  .I connected with Mark Nichols on 07/31/21 at 12:30 PM EDT by telephone visit and verified that I am speaking with the correct person using two identifiers.   I discussed the limitations, risks, security and privacy concerns of performing an evaluation and management service by telemedicine and the availability of in-person appointments. I also discussed with the patient that there may be a patient responsible charge related to this service. The patient expressed understanding and agreed to proceed.   Other persons participating in the visit and their role in the encounter: None  Patient's location: Home  Provider's location: [place]  I called Dr. [name] to discuss his lab work-up and his lymph node biopsy results done to evaluate his incidentally noted generalized lymphadenopathy. He notes no acute new symptoms.  Tolerated his lymph node biopsy well without any unexpected discomfort.  Excisional lymph node biopsy from 07/17/2021 showed  Enlarged lymph nodes with progressive transformation of germinal centers. No morphologic or immunophenotypic evidence of a lymphoproliferative process. Flow cytometry performed on the sample identified a kappa-predominant CD10 positive B-cell  population that comprised 10% of all lymphocytes.  The significance of this is uncertain given the absence of morphologic  features.  B cell receptor studies and FISH for translocation 11; 14 pending.  Other lab results were also discussed in details.  The results and notes no fevers chills night sweats unexpected weight loss or other specific new focal symptoms.   MEDICAL HISTORY:  Past Medical History:  Diagnosis Date   GERD (gastroesophageal reflux disease)    Hypercholesteremia    Hypertension    Pneumonia    Sleep apnea   Hypertension Dyslipidemia Obstructive [name] GERD Left adrenal possible adenoma Glucose intolerance Obesity .There is no height or weight on file to calculate BMI. Tobacco use  SURGICAL HISTORY: Past Surgical History:  Procedure Laterality Date   AXILLARY LYMPH NODE BIOPSY Left 07/17/2021   Procedure: LEFT AXILLARY LYMPH NODE BIOPSY;  [name], MD;  [name];  Service: General;  Laterality: Left;    SOCIAL HISTORY: Social History   Socioeconomic History   Marital status: Married    Spouse name: Not on file   Number of children: Not on file   Years of education: Not on file   Highest education level: Not on file  Occupational History   Not on file  Tobacco Use   Smoking status: Some Days   Smokeless tobacco: Never   Tobacco comments:    Patient smokes rarely   Vaping Use   Vaping Use: Never used  Substance and Sexual Activity   Alcohol use: Yes    Comment: socailly   Drug use: No   Sexual activity: Not on file  Other Topics Concern   Not on file  Social History Narrative   Not on file   Social Determinants of Health   Financial Resource Strain: Not on file  Food Insecurity: Not on file  Transportation Needs: Not on file  Physical Activity: Not on file  Stress: Not on file  Social Connections: Not on file  Intimate Partner Violence: Not on file    FAMILY HISTORY: Mother had a history of some liver disease  ALLERGIES:  is allergic to chloroquine.  MEDICATIONS:  Current Outpatient Medications   Medication Sig Dispense Refill   amLODipine-benazepril (LOTREL) 10-40 MG capsule TAKE 1 CAPSULE BY MOUTH DAILY. NEEDS APPT FOR FUTURE REFILLS 90 capsule 1   carvedilol (COREG) 6.25 MG tablet Take 1 tablet by mouth twice daily. 60 tablet 6   eplerenone (INSPRA) 50 MG tablet Take 1 tablet (50 mg total) by mouth daily. (Patient not taking: No sig reported) 30 tablet 3   famotidine (PEPCID) 20 MG tablet Take 20 mg by mouth 2 (two) times daily.     hydrALAZINE (APRESOLINE) 50 MG tablet Take 1 tablet (50 mg total) by mouth twice daily in the morning and at bedtime. 60 tablet 6   Magnesium Oxide 200 MG TABS Take 1 tablet (200 mg total) by mouth in the morning and at bedtime. 60 tablet 3   potassium chloride (KLOR-CON) 10 MEQ tablet Take 4 tablets (40 mEq total) by mouth daily. 120 tablet 3   rosuvastatin (CRESTOR) 10 MG tablet Take 1 tablet (10 mg total) by mouth daily. Must have appointment for further refills 90 tablet 1   traMADol (ULTRAM) 50 MG tablet Take 2 tablets (100 mg total) by mouth every 6 (six) hours as needed. 8 tablet 0   No current facility-administered medications for this visit.    REVIEW OF SYSTEMS:    .  10 Point review of Systems was done is negative except as noted above.   PHYSICAL EXAMINATION: Telemedicine visit  LABORATORY DATA:  I have reviewed the data as listed  . CBC Latest Ref Rng & Units 07/09/2021 06/11/2021 04/18/2015  WBC 4.0 - 10.5 K/uL 4.1 4.0 5.6  Hemoglobin 13.0 - 17.0 g/dL 13.3 13.0 16.0  Hematocrit 39.0 - 52.0 % 40.6 40.7 46.7  Platelets 150 - 400 K/uL 210 204 202    . CMP Latest Ref Rng & Units 07/15/2021 07/09/2021 06/19/2021  Glucose 70 - 99 mg/dL - 103(H) 128(H)  BUN 6 - 20 mg/dL - 12 13  Creatinine 0.61 - 1.24 mg/dL - 1.24 1.23  Sodium 135 - 145 mmol/L - 137 135  Potassium 3.5 - 5.1 mmol/L 3.6 3.7 4.4  Chloride 98 - 111 mmol/L - 105 102  CO2 22 - 32 mmol/L - 22 26  Calcium 8.9 - 10.3 mg/dL - 9.3 9.7  Total Protein 6.5 - 8.1 g/dL - 9.5(H)  9.2(H)  Total Bilirubin 0.3 - 1.2 mg/dL - 0.3 0.4  Alkaline Phos 38 - 126 U/L - 77 85  AST 15 - 41 U/L - 23 29  ALT 0 - 44 U/L - 46(H) 40   Component     Latest Ref Rng & Units 07/09/2021  Speckled Pattern      2,4,5,29 (H)  NOTE:      Comment  LDH     98 - 192 U/L 160  Magnesium     1.7 - 2.4 mg/dL 1.9  HCV Ab     0.0 - 0.9 s/co ratio <0.1 (A)  Hepatitis B Surface Ag     NON REACTIVE NON REACTIVE  Hep B Core Total Ab     NON REACTIVE NON REACTIVE  HIV Screen 4th Generation wRfx     Non Reactive Non Reactive  EBV VCA IgM     0.0 - 35.9 U/mL <36.0  EBV VCA IgG     0.0 - 17.9 U/mL 99.1 (H)  Vitamin D, 25-Hydroxy     30 - 100 ng/mL 31.27  ANA Ab, IFA      Positive (A)  CMV IgM     0.0 - 29.9 AU/mL <30.0  CMV Ab - IgG     0.00 - 0.59 U/mL 7.00 (H)   FANA Staining Patterns Order: [id number] Status: Final result   Visible to patient: Yes (seen)   Next appt: 01/08/2022 at 09:30 AM in Oncology ([facility])   0 Result Notes Component 3 wk ago   Speckled Pattern 2,4,5,29 High    Comment: 1:1280  ICAP nomenclature: [facility]      SURGICAL PATHOLOGY  CASE: [id number]  PATIENT: Amory Coppock  Surgical Pathology Report      Clinical History: left axillary and generalized adenopathy    FINAL MICROSCOPIC DIAGNOSIS:   A. LYMPH NODE, LEFT AXILLARY, EXCISION:  -  Enlarged lymph nodes with progressive transformation of germinal  centers  -  No morphologic or immunophenotypic evidence of a lymphoproliferative  process  -  See comment   COMMENT:   The excision specimen consists of five lymph nodes of varying sizes.  Overall nodal architecture is preserved.  The most notable feature is  enlarged germinal centers with irregular margins.  By  immunohistochemistry, the germinal centers are positive for CD20, CD10,  BCL6, but negative for Bcl-2 and cyclin D1.  CD23 highlights expanded  follicular dendritic meshworks.  Ki-67 highlights preserved polarization  within  a large portion  of the germinal centers.  EBV by in situ  hybridization is negative. CD30 highlights scattered enlarged  lymphocytes which do not express CD15, these are favored to represent  activated immunoblasts.  CD3 and CD5 highlight background T cells.  CD68  highlights histiocytes.  Flow cytometry performed on the sample (see  [id number]) identified a kappa-predominant CD10 positive B-cell  population that comprised 10% of all lymphocytes.  The significance of  this is uncertain given the absence of morphologic features.   Overall, the findings are favored to represent progressive  transformation of germinal centers.  There are no definitive features  present to suggest a lymphoproliferative process; however, given the  presence of a kappa predominant population by flow cytometry, B-cell  clonality studies and FISH t(14;18)) are pending and will be reported in  an addendum.  Dr. Nita Sickle (hematopathologist) reviewed the case and  agrees with the above diagnosis.    RADIOGRAPHIC STUDIES: I have personally reviewed the radiological images as listed and agreed with the findings in the report. CT Chest W Contrast  Result Date: 07/08/2021 CLINICAL DATA:  Hyperaldosteronism, mediastinal fullness on coronary CT angiography. EXAM: CT CHEST WITH CONTRAST CT ABDOMEN WITHOUT AND WITH CONTRAST TECHNIQUE: Multidetector CT imaging of the abdomen was performed without intravenous contrast. Multidetector CT imaging of the chest and abdomen was then performed during bolus administration of intravenous contrast. CONTRAST:  136mL OMNIPAQUE IOHEXOL 350 MG/ML SOLN COMPARISON:  Coronary CT angiography and CT abdomen pelvis 06/20/2021. FINDINGS: CT CHEST WITH CONTRAST Cardiovascular: Heart is enlarged.  No pericardial effusion. Mediastinum/Nodes: Low internal jugular lymph nodes are subcentimeter in short axis size. Nodularity is seen throughout the prevascular space, measuring up to 1.6 x 5.0 cm anterior to  the pulmonic trunk (2/61). There may be some associated thymic tissue. No additional mediastinal or hilar adenopathy. There are numerous axillary and subpectoral lymph nodes bilaterally, measuring up to 1.4 cm on the left (2/51). Esophagus is grossly unremarkable. Prepericardiac and distal periesophageal lymph nodes are not enlarged by CT size criteria. Lungs/Pleura: Lungs are clear. No pleural fluid. Airway is unremarkable. Musculoskeletal: Degenerative changes in the spine. No worrisome lytic or sclerotic lesions. CT ABDOMEN WITHOUT AND WITH CONTRAST Hepatobiliary: Liver and gallbladder are unremarkable. No biliary ductal dilatation. Pancreas: Negative. Spleen: Negative. Adrenals/Urinary Tract: Right adrenal gland is unremarkable. Areas of nodular thickening in the left adrenal gland have absolute washouts of greater than 60%. Kidneys are unremarkable. Stomach/Bowel: Atherosclerotic calcification of the aorta. Gastrohepatic ligament lymph nodes are subcentimeter in short axis size, as are small bowel mesenteric lymph nodes. Vascular/Lymphatic: Stomach and visualized portions of the small bowel and colon are unremarkable. Other: No free fluid.  Mesenteries and peritoneum are unremarkable. Musculoskeletal: Degenerative changes in the spine. No worrisome lytic or sclerotic lesions. IMPRESSION: 1. Prevascular soft tissue nodules and subpectoral/axillary adenopathy bilaterally, findings highly worrisome for lymphoma. 2. Left adrenal nodular thickening/adenomas. No further follow-up is necessary. 3.  Aortic atherosclerosis (ICD10-I70.0). Electronically Signed   By: Lorin Picket M.D.   On: 07/08/2021 15:34   CT ABDOMEN W WO CONTRAST  Result Date: 07/08/2021 CLINICAL DATA:  Hyperaldosteronism, mediastinal fullness on coronary CT angiography. EXAM: CT CHEST WITH CONTRAST CT ABDOMEN WITHOUT AND WITH CONTRAST TECHNIQUE: Multidetector CT imaging of the abdomen was performed without intravenous contrast. Multidetector  CT imaging of the chest and abdomen was then performed during bolus administration of intravenous contrast. CONTRAST:  19mL OMNIPAQUE IOHEXOL 350 MG/ML SOLN COMPARISON:  Coronary CT angiography and CT abdomen pelvis 06/20/2021. FINDINGS: CT CHEST  WITH CONTRAST Cardiovascular: Heart is enlarged.  No pericardial effusion. Mediastinum/Nodes: Low internal jugular lymph nodes are subcentimeter in short axis size. Nodularity is seen throughout the prevascular space, measuring up to 1.6 x 5.0 cm anterior to the pulmonic trunk (2/61). There may be some associated thymic tissue. No additional mediastinal or hilar adenopathy. There are numerous axillary and subpectoral lymph nodes bilaterally, measuring up to 1.4 cm on the left (2/51). Esophagus is grossly unremarkable. Prepericardiac and distal periesophageal lymph nodes are not enlarged by CT size criteria. Lungs/Pleura: Lungs are clear. No pleural fluid. Airway is unremarkable. Musculoskeletal: Degenerative changes in the spine. No worrisome lytic or sclerotic lesions. CT ABDOMEN WITHOUT AND WITH CONTRAST Hepatobiliary: Liver and gallbladder are unremarkable. No biliary ductal dilatation. Pancreas: Negative. Spleen: Negative. Adrenals/Urinary Tract: Right adrenal gland is unremarkable. Areas of nodular thickening in the left adrenal gland have absolute washouts of greater than 60%. Kidneys are unremarkable. Stomach/Bowel: Atherosclerotic calcification of the aorta. Gastrohepatic ligament lymph nodes are subcentimeter in short axis size, as are small bowel mesenteric lymph nodes. Vascular/Lymphatic: Stomach and visualized portions of the small bowel and colon are unremarkable. Other: No free fluid.  Mesenteries and peritoneum are unremarkable. Musculoskeletal: Degenerative changes in the spine. No worrisome lytic or sclerotic lesions. IMPRESSION: 1. Prevascular soft tissue nodules and subpectoral/axillary adenopathy bilaterally, findings highly worrisome for lymphoma. 2.  Left adrenal nodular thickening/adenomas. No further follow-up is necessary. 3.  Aortic atherosclerosis (ICD10-I70.0). Electronically Signed   By: Lorin Picket M.D.   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 No obvious symptoms no recent viral infection or febrile illness. No obvious symptoms suggestive of autoimmune condition.  He did have some arthralgias and myalgias but these resolved with electrolyte replacement. PLAN -I discussed the available lab results with the patient CBC unremarkable, CMP with some increase in total protein to 9.5 with an albumin of 3.9 suggesting increase in gamma globulin fraction. -HIV hepatitis B and hepatitis C testing negative -EBV and CMV testing show history of previous infection but no acute infection. -Patient was noted to be ANA positive speckled type at titers of 1 : 1280.  We discussed and he does not seem to have any obvious symptoms suggestive of an autoimmune condition at this time.  This type of speckled pattern of ANA can be elevated in the right clinical context in SLE, MCTD, SS and Sjogren's. -Lymph node biopsy results showed Excisional lymph node biopsy from 07/17/2021 showed  Enlarged lymph nodes with progressive transformation of germinal centers. No morphologic or immunophenotypic evidence of a lymphoproliferative process. Flow cytometry performed on the sample identified a kappa-predominant CD10 positive B-cell population that comprised 10% of all lymphocytes.  The significance of this is uncertain given the absence of morphologic features.  B cell receptor studies and FISH for translocation 14;18 pending. -We discussed that progressive transformation of germinal centers is a relatively uncommon entity that could represent reactive  change but also has some association with increased possibility of nodular lymphocyte predominant Hodgkin's and much more rarely follicular lymphoma. -We shall await his B-cell receptor gene rearrangement testing and FISH t(14;18) to rule out coexistent follicular lymphoma. -We discussed option of considering PET CT scan to evaluate for the level of activity of his lymph nodes and possible additional sampling of active lesions to rule out any possibility of concurrent lymphoma.  He prefers to pursue interval imaging to monitor the lymphadenopathy and hold off on the PET scan at this time. -We also discussed monitoring for any other rheumatologic symptomatology given his positive ANA level and consideration of rheumatology referral if any other symptoms arise.  He is agreeable with this plan.  Currently no rashes no myalgias or arthralgias no dry mouth no dry eyes no other focal symptoms suggestive of an autoimmune process. 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 -Counseled on smoking cessation  2) HTN 3) HLD 4) OSA on CPAP 5) Smoking 6) left adrenal gland nodular thickening PLAN -mx per cardiology and PCP No orders of the defined types were placed in this encounter.  Follow-up Plz cancel appointment scheduled with Dr Irene Limbo on 11/2. CT chest/abd/pelvis in 24 weeks  Labs in 24 weeks RTC with Dr Irene Limbo in 25 weeks   All of the patients questions were answered with apparent satisfaction. The patient knows to call the clinic with any problems, questions or concerns.    Sullivan Lone MD MS AAHIVMS [facility] Hematology/Oncology Physician [place]  07/31/2021 11:52 AM

## 2021-07-31 ENCOUNTER — Ambulatory Visit: Payer: 59 | Admitting: Hematology

## 2021-07-31 NOTE — Addendum Note (Signed)
Addended by: Sullivan Lone on: 07/31/2021 12:15 PM   Modules accepted: Orders

## 2021-08-05 LAB — SURGICAL PATHOLOGY

## 2021-08-14 ENCOUNTER — Encounter (HOSPITAL_COMMUNITY): Payer: Self-pay

## 2021-08-15 DIAGNOSIS — G4733 Obstructive sleep apnea (adult) (pediatric): Secondary | ICD-10-CM | POA: Diagnosis not present

## 2021-08-20 ENCOUNTER — Other Ambulatory Visit (HOSPITAL_COMMUNITY): Payer: Self-pay

## 2021-08-20 ENCOUNTER — Other Ambulatory Visit (HOSPITAL_COMMUNITY): Payer: Self-pay | Admitting: Internal Medicine

## 2021-08-20 DIAGNOSIS — I10 Essential (primary) hypertension: Secondary | ICD-10-CM

## 2021-08-20 MED ORDER — AMLODIPINE BESY-BENAZEPRIL HCL 10-40 MG PO CAPS
1.0000 | ORAL_CAPSULE | Freq: Every day | ORAL | 3 refills | Status: DC
  Filled 2021-08-20: qty 90, 90d supply, fill #0
  Filled 2021-11-27: qty 90, 90d supply, fill #1
  Filled 2022-02-21: qty 42, 42d supply, fill #2
  Filled 2022-02-25: qty 48, 48d supply, fill #2
  Filled 2022-05-26: qty 90, 90d supply, fill #3

## 2021-09-26 ENCOUNTER — Other Ambulatory Visit (HOSPITAL_COMMUNITY): Payer: Self-pay

## 2021-10-10 ENCOUNTER — Telehealth (HOSPITAL_COMMUNITY): Payer: Self-pay | Admitting: *Deleted

## 2021-10-10 DIAGNOSIS — I10 Essential (primary) hypertension: Secondary | ICD-10-CM

## 2021-10-10 DIAGNOSIS — E269 Hyperaldosteronism, unspecified: Secondary | ICD-10-CM

## 2021-10-10 NOTE — Telephone Encounter (Signed)
Pt request cmet/mag, ok to order per Dr Vaughan Browner, orders placed, appt sch for [facility] 1/16

## 2021-10-14 ENCOUNTER — Ambulatory Visit (HOSPITAL_COMMUNITY)
Admission: RE | Admit: 2021-10-14 | Discharge: 2021-10-14 | Disposition: A | Discharge status: Home / Self Care | Payer: 59 | Source: Ambulatory Visit | Attending: Cardiology | Admitting: Cardiology

## 2021-10-14 ENCOUNTER — Other Ambulatory Visit: Payer: Self-pay

## 2021-10-14 DIAGNOSIS — I10 Essential (primary) hypertension: Secondary | ICD-10-CM | POA: Insufficient documentation

## 2021-10-14 DIAGNOSIS — E269 Hyperaldosteronism, unspecified: Secondary | ICD-10-CM | POA: Insufficient documentation

## 2021-10-14 LAB — COMPREHENSIVE METABOLIC PANEL
ALT: 46 U/L — ABNORMAL HIGH (ref 0–44)
AST: 30 U/L (ref 15–41)
Albumin: 3.5 g/dL (ref 3.5–5.0)
Alkaline Phosphatase: 78 U/L (ref 38–126)
Anion gap: 8 (ref 5–15)
BUN: 10 mg/dL (ref 6–20)
CO2: 26 mmol/L (ref 22–32)
Calcium: 9.5 mg/dL (ref 8.9–10.3)
Chloride: 100 mmol/L (ref 98–111)
Creatinine, Ser: 1.12 mg/dL (ref 0.61–1.24)
GFR, Estimated: >60 mL/min (ref >60)
Glucose, Bld: 119 mg/dL — ABNORMAL HIGH (ref 70–99)
Potassium: 4.3 mmol/L (ref 3.5–5.1)
Sodium: 134 mmol/L — ABNORMAL LOW (ref 135–145)
Total Bilirubin: 0.5 mg/dL (ref 0.3–1.2)
Total Protein: 8.9 g/dL — ABNORMAL HIGH (ref 6.5–8.1)

## 2021-10-14 LAB — MAGNESIUM: Magnesium: 1.9 mg/dL (ref 1.7–2.4)

## 2021-10-25 DIAGNOSIS — H11153 Pinguecula, bilateral: Secondary | ICD-10-CM | POA: Diagnosis not present

## 2021-10-25 DIAGNOSIS — H25813 Combined forms of age-related cataract, bilateral: Secondary | ICD-10-CM | POA: Diagnosis not present

## 2021-10-25 DIAGNOSIS — Z8619 Personal history of other infectious and parasitic diseases: Secondary | ICD-10-CM | POA: Diagnosis not present

## 2021-10-25 DIAGNOSIS — H35033 Hypertensive retinopathy, bilateral: Secondary | ICD-10-CM | POA: Diagnosis not present

## 2021-10-25 DIAGNOSIS — H11823 Conjunctivochalasis, bilateral: Secondary | ICD-10-CM | POA: Diagnosis not present

## 2021-11-02 ENCOUNTER — Other Ambulatory Visit (HOSPITAL_COMMUNITY): Payer: Self-pay

## 2021-11-13 DIAGNOSIS — G4733 Obstructive sleep apnea (adult) (pediatric): Secondary | ICD-10-CM | POA: Diagnosis not present

## 2021-11-27 ENCOUNTER — Other Ambulatory Visit (HOSPITAL_COMMUNITY): Payer: Self-pay

## 2021-12-05 ENCOUNTER — Other Ambulatory Visit (HOSPITAL_COMMUNITY): Payer: Self-pay

## 2022-01-07 ENCOUNTER — Other Ambulatory Visit (HOSPITAL_COMMUNITY): Payer: Self-pay

## 2022-01-08 ENCOUNTER — Inpatient Hospital Stay: Payer: 59 | Attending: Hematology

## 2022-01-08 DIAGNOSIS — I10 Essential (primary) hypertension: Secondary | ICD-10-CM | POA: Insufficient documentation

## 2022-01-08 DIAGNOSIS — G4733 Obstructive sleep apnea (adult) (pediatric): Secondary | ICD-10-CM | POA: Insufficient documentation

## 2022-01-08 DIAGNOSIS — Z79899 Other long term (current) drug therapy: Secondary | ICD-10-CM | POA: Insufficient documentation

## 2022-01-08 DIAGNOSIS — R768 Other specified abnormal immunological findings in serum: Secondary | ICD-10-CM | POA: Insufficient documentation

## 2022-01-08 DIAGNOSIS — K219 Gastro-esophageal reflux disease without esophagitis: Secondary | ICD-10-CM | POA: Insufficient documentation

## 2022-01-08 DIAGNOSIS — R591 Generalized enlarged lymph nodes: Secondary | ICD-10-CM | POA: Insufficient documentation

## 2022-01-08 DIAGNOSIS — F17210 Nicotine dependence, cigarettes, uncomplicated: Secondary | ICD-10-CM | POA: Insufficient documentation

## 2022-01-08 DIAGNOSIS — E7800 Pure hypercholesterolemia, unspecified: Secondary | ICD-10-CM | POA: Insufficient documentation

## 2022-01-10 ENCOUNTER — Inpatient Hospital Stay: Payer: 59

## 2022-01-10 DIAGNOSIS — Z79899 Other long term (current) drug therapy: Secondary | ICD-10-CM | POA: Diagnosis not present

## 2022-01-10 DIAGNOSIS — R768 Other specified abnormal immunological findings in serum: Secondary | ICD-10-CM | POA: Diagnosis not present

## 2022-01-10 DIAGNOSIS — K219 Gastro-esophageal reflux disease without esophagitis: Secondary | ICD-10-CM | POA: Diagnosis not present

## 2022-01-10 DIAGNOSIS — R591 Generalized enlarged lymph nodes: Secondary | ICD-10-CM | POA: Diagnosis not present

## 2022-01-10 DIAGNOSIS — F17210 Nicotine dependence, cigarettes, uncomplicated: Secondary | ICD-10-CM | POA: Diagnosis not present

## 2022-01-10 DIAGNOSIS — G4733 Obstructive sleep apnea (adult) (pediatric): Secondary | ICD-10-CM | POA: Diagnosis not present

## 2022-01-10 DIAGNOSIS — E7800 Pure hypercholesterolemia, unspecified: Secondary | ICD-10-CM | POA: Diagnosis not present

## 2022-01-10 DIAGNOSIS — I10 Essential (primary) hypertension: Secondary | ICD-10-CM | POA: Diagnosis not present

## 2022-01-10 LAB — CBC WITH DIFFERENTIAL/PLATELET
Abs Immature Granulocytes: 0 10*3/uL (ref 0.00–0.07)
Basophils Absolute: 0 10*3/uL (ref 0.0–0.1)
Basophils Relative: 1 %
Eosinophils Absolute: 0.1 10*3/uL (ref 0.0–0.5)
Eosinophils Relative: 3 %
HCT: 39.4 % (ref 39.0–52.0)
Hemoglobin: 12.6 g/dL — ABNORMAL LOW (ref 13.0–17.0)
Immature Granulocytes: 0 %
Lymphocytes Relative: 33 %
Lymphs Abs: 1.3 10*3/uL (ref 0.7–4.0)
MCH: 26.4 pg (ref 26.0–34.0)
MCHC: 32 g/dL (ref 30.0–36.0)
MCV: 82.4 fL (ref 80.0–100.0)
Monocytes Absolute: 0.4 10*3/uL (ref 0.1–1.0)
Monocytes Relative: 10 %
Neutro Abs: 2.1 10*3/uL (ref 1.7–7.7)
Neutrophils Relative %: 53 %
Platelets: 189 10*3/uL (ref 150–400)
RBC: 4.78 MIL/uL (ref 4.22–5.81)
RDW: 14.6 % (ref 11.5–15.5)
WBC: 3.8 10*3/uL — ABNORMAL LOW (ref 4.0–10.5)
nRBC: 0 % (ref 0.0–0.2)

## 2022-01-10 LAB — CMP (CANCER CENTER ONLY)
ALT: 39 U/L (ref 0–44)
AST: 26 U/L (ref 15–41)
Albumin: 4 g/dL (ref 3.5–5.0)
Alkaline Phosphatase: 61 U/L (ref 38–126)
Anion gap: 4 — ABNORMAL LOW (ref 5–15)
BUN: 16 mg/dL (ref 6–20)
CO2: 28 mmol/L (ref 22–32)
Calcium: 9.1 mg/dL (ref 8.9–10.3)
Chloride: 103 mmol/L (ref 98–111)
Creatinine: 1.1 mg/dL (ref 0.61–1.24)
GFR, Estimated: >60 mL/min (ref >60)
Glucose, Bld: 117 mg/dL — ABNORMAL HIGH (ref 70–99)
Potassium: 3.6 mmol/L (ref 3.5–5.1)
Sodium: 135 mmol/L (ref 135–145)
Total Bilirubin: 0.3 mg/dL (ref 0.3–1.2)
Total Protein: 8.8 g/dL — ABNORMAL HIGH (ref 6.5–8.1)

## 2022-01-10 LAB — SEDIMENTATION RATE: Sed Rate: 12 mm/hr (ref 0–16)

## 2022-01-10 LAB — LACTATE DEHYDROGENASE: LDH: 118 U/L (ref 98–192)

## 2022-01-13 LAB — MULTIPLE MYELOMA PANEL, SERUM
Albumin SerPl Elph-Mcnc: 3.7 g/dL (ref 2.9–4.4)
Albumin/Glob SerPl: 0.9 (ref 0.7–1.7)
Alpha 1: 0.2 g/dL (ref 0.0–0.4)
Alpha2 Glob SerPl Elph-Mcnc: 0.7 g/dL (ref 0.4–1.0)
B-Globulin SerPl Elph-Mcnc: 0.9 g/dL (ref 0.7–1.3)
Gamma Glob SerPl Elph-Mcnc: 2.6 g/dL — ABNORMAL HIGH (ref 0.4–1.8)
Globulin, Total: 4.4 g/dL — ABNORMAL HIGH (ref 2.2–3.9)
IgA: 191 mg/dL (ref 90–386)
IgG (Immunoglobin G), Serum: 2975 mg/dL — ABNORMAL HIGH (ref 603–1613)
IgM (Immunoglobulin M), Srm: 50 mg/dL (ref 20–172)
Total Protein ELP: 8.1 g/dL (ref 6.0–8.5)

## 2022-01-13 LAB — KAPPA/LAMBDA LIGHT CHAINS
Kappa free light chain: 60.6 mg/L — ABNORMAL HIGH (ref 3.3–19.4)
Kappa, lambda light chain ratio: 2.45 — ABNORMAL HIGH (ref 0.26–1.65)
Lambda free light chains: 24.7 mg/L (ref 5.7–26.3)

## 2022-01-15 ENCOUNTER — Other Ambulatory Visit: Payer: Self-pay

## 2022-01-15 ENCOUNTER — Inpatient Hospital Stay (HOSPITAL_BASED_OUTPATIENT_CLINIC_OR_DEPARTMENT_OTHER): Payer: 59 | Admitting: Hematology

## 2022-01-15 VITALS — BP 141/91 | HR 64 | Temp 97.5°F | Resp 18 | Wt 270.0 lb

## 2022-01-15 DIAGNOSIS — I10 Essential (primary) hypertension: Secondary | ICD-10-CM | POA: Diagnosis not present

## 2022-01-15 DIAGNOSIS — R591 Generalized enlarged lymph nodes: Secondary | ICD-10-CM

## 2022-01-15 DIAGNOSIS — G4733 Obstructive sleep apnea (adult) (pediatric): Secondary | ICD-10-CM | POA: Diagnosis not present

## 2022-01-15 DIAGNOSIS — K219 Gastro-esophageal reflux disease without esophagitis: Secondary | ICD-10-CM | POA: Diagnosis not present

## 2022-01-15 DIAGNOSIS — Z79899 Other long term (current) drug therapy: Secondary | ICD-10-CM | POA: Diagnosis not present

## 2022-01-15 DIAGNOSIS — E7800 Pure hypercholesterolemia, unspecified: Secondary | ICD-10-CM | POA: Diagnosis not present

## 2022-01-15 DIAGNOSIS — R768 Other specified abnormal immunological findings in serum: Secondary | ICD-10-CM | POA: Diagnosis not present

## 2022-01-15 DIAGNOSIS — F17210 Nicotine dependence, cigarettes, uncomplicated: Secondary | ICD-10-CM | POA: Diagnosis not present

## 2022-01-21 NOTE — Progress Notes (Signed)
. ? ? ?HEMATOLOGY/ONCOLOGY CLINIC VISIT NOTE ? ?Date of Service: 01/15/2022 ? ? ?Patient Care Team: ?Bensimhon, [name], MD as PCP - General (Cardiology) ? ?CHIEF COMPLAINTS/PURPOSE OF CONSULTATION:  ?Follow-up for lymphadenopathy with biopsy showing progressive transformation of germinal centers ? ?HISTORY OF PRESENTING ILLNESS:  ?Please see previous notes for details on initial presentation ? ?INTERVAL HISTORY ? 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 ?She notes she has been doing well overall.  No fevers no chills no night sweats no unexpected weight loss. ?He is trying to quit smoking. ?No overt new lumps or bumps. ?No skin rashes. ?No focal joint pain or swelling. ?Labs done on 01/10/2022 showed normal CBC with a hemoglobin of 3.8k ?CMP within normal limits ?LDH within normal limits at 118 ?Sed rate 12 ?Myeloma panel showed elevated IgG levels of 2975 with no monoclonal protein spike.  Polyclonal hypogammaglobinemia noted. ? ?He has not had his planned CT chest abdomen pelvis yet. ? ?MEDICAL HISTORY:  ?Past Medical History:  ?Diagnosis Date  ? GERD (gastroesophageal reflux disease)   ? Hypercholesteremia   ? Hypertension   ? Pneumonia   ? Sleep apnea   ?Hypertension ?Dyslipidemia ?Obstructive [name] ?GERD ?Left adrenal possible adenoma ?Glucose intolerance ?Obesity .Body mass index is 34.67 kg/m?. ?Tobacco use ? ?SURGICAL HISTORY: ?Past Surgical History:  ?Procedure Laterality Date  ? AXILLARY LYMPH NODE BIOPSY Left 07/17/2021  ? Procedure: LEFT AXILLARY LYMPH NODE BIOPSY;  [name], MD;  [name];  Service: General;  Laterality: Left;  ? ? ?SOCIAL HISTORY: ?Social History  ? ?Socioeconomic History  ? Marital status: Married  ?  Spouse name: Not on file  ? Number of children: Not on file  ? Years of education: Not on  file  ? Highest education level: Not on file  ?Occupational History  ? Not on file  ?Tobacco Use  ? Smoking status: Some Days  ? Smokeless tobacco: Never  ? Tobacco comments:  ?  Patient smokes rarely   ?Vaping Use  ? Vaping Use: Never used  ?Substance and Sexual Activity  ? Alcohol use: Yes  ?  Comment: socailly  ? Drug use: No  ? Sexual activity: Not on file  ?Other Topics Concern  ? Not on file  ?Social History Narrative  ? Not on file  ? ?Social Determinants of Health  ? ?Financial Resource Strain: Not on file  ?Food Insecurity: Not on file  ?Transportation Needs: Not on file  ?Physical Activity: Not on file  ?Stress: Not on file  ?Social Connections: Not on file  ?Intimate Partner Violence: Not on file  ? ? ?FAMILY HISTORY: ?Mother had a history of some liver disease ? ?ALLERGIES:  is allergic to chloroquine. ? ?MEDICATIONS:  ?Current Outpatient Medications  ?Medication Sig Dispense Refill  ? amLODipine-benazepril (LOTREL) 10-40 MG capsule Take 1 capsule by mouth daily. 90 capsule 3  ? carvedilol (COREG) 6.25 MG tablet Take 1 tablet by mouth twice daily. 60 tablet 6  ? famotidine (PEPCID) 20 MG tablet Take 20 mg by mouth 2 (two) times daily.    ? hydrALAZINE (APRESOLINE) 50 MG tablet Take 1 tablet (50 mg total) by mouth twice daily in the morning and at bedtime. 60 tablet 6  ? rosuvastatin (CRESTOR) 10 MG tablet Take 1 tablet (10 mg total) by mouth daily. Must have appointment for further refills 90  tablet 1  ? eplerenone (INSPRA) 50 MG tablet Take 1 tablet (50 mg total) by mouth daily. 30 tablet 3  ? Magnesium Oxide 200 MG TABS Take 1 tablet (200 mg total) by mouth in the morning and at bedtime. (Patient not taking: Reported on 01/15/2022) 60 tablet 3  ? potassium chloride (KLOR-CON) 10 MEQ tablet Take 4 tablets (40 mEq total) by mouth daily. (Patient not taking: Reported on 01/15/2022) 120 tablet 3  ? traMADol (ULTRAM) 50 MG tablet Take 2 tablets (100 mg total) by mouth every 6 (six) hours as needed. (Patient  not taking: Reported on 01/15/2022) 8 tablet 0  ? ?No current facility-administered medications for this visit.  ? ? ?REVIEW OF SYSTEMS:   ?10 Point review of Systems was done is negative except as noted above. ? ? ?PHYSICAL EXAMINATION: ?.BP (!) 141/91 (BP Location: Left Arm, Patient Position: Sitting)   Pulse 64   Temp (!) 97.5 ?F (36.4 ?C) (Temporal)   Resp 18   Wt 270 lb (122.5 kg)   SpO2 100%   BMI 34.67 kg/m?  ?NAD ?GENERAL:alert, in no acute distress and comfortable ?SKIN: no acute rashes, no significant lesions ?EYES: conjunctiva are pink and non-injected, sclera anicteric ?OROPHARYNX: MMM, no exudates, no oropharyngeal erythema or ulceration ?NECK: supple, no JVD ?LYMPH:  no palpable lymphadenopathy in the cervical, axillary or inguinal regions ?LUNGS: clear to auscultation b/l with normal respiratory effort ?HEART: regular rate & rhythm ?ABDOMEN:  normoactive bowel sounds , non tender, not distended. ?Extremity: no pedal edema ?PSYCH: alert & oriented x 3 with fluent speech ?NEURO: no focal motor/sensory deficits ? ? ?LABORATORY DATA:  ?I have reviewed the data as listed ? ?. ? ?  Latest Ref Rng & Units 01/10/2022  ?  4:04 PM 07/09/2021  ?  2:57 PM 06/11/2021  ? 10:47 AM  ?CBC  ?WBC 4.0 - 10.5 K/uL 3.8   4.1   4.0    ?Hemoglobin 13.0 - 17.0 g/dL 12.6   13.3   13.0    ?Hematocrit 39.0 - 52.0 % 39.4   40.6   40.7    ?Platelets 150 - 400 K/uL 189   210   204    ? ? ?. ? ?  Latest Ref Rng & Units 01/10/2022  ?  4:04 PM 10/14/2021  ?  9:14 AM 07/15/2021  ?  8:29 AM  ?CMP  ?Glucose 70 - 99 mg/dL 117   119     ?BUN 6 - 20 mg/dL 16   10     ?Creatinine 0.61 - 1.24 mg/dL 1.10   1.12     ?Sodium 135 - 145 mmol/L 135   134     ?Potassium 3.5 - 5.1 mmol/L 3.6   4.3   3.6    ?Chloride 98 - 111 mmol/L 103   100     ?CO2 22 - 32 mmol/L 28   26     ?Calcium 8.9 - 10.3 mg/dL 9.1   9.5     ?Total Protein 6.5 - 8.1 g/dL 8.8   8.9     ?Total Bilirubin 0.3 - 1.2 mg/dL 0.3   0.5     ?Alkaline Phos 38 - 126 U/L 61   78     ?AST  15 - 41 U/L 26   30     ?ALT 0 - 44 U/L 39   46     ? ?. ?Lab Results  ?Component Value Date  ? LDH 118 01/10/2022  ? ? ? ?  Component ?    Latest Ref Rng & Units 07/09/2021  ?Speckled Pattern ?     2,4,5,29 (H)  ?NOTE: ?     Comment  ?LDH ?    98 - 192 U/L 160  ?Magnesium ?    1.7 - 2.4 mg/dL 1.9  ?HCV Ab ?    0.0 - 0.9 s/co ratio <0.1 (A)  ?Hepatitis B Surface Ag ?    NON REACTIVE NON REACTIVE  ?Hep B Core Total Ab ?    NON REACTIVE NON REACTIVE  ?HIV Screen 4th Generation wRfx ?    Non Reactive Non Reactive  ?EBV VCA IgM ?    0.0 - 35.9 U/mL <36.0  ?EBV VCA IgG ?    0.0 - 17.9 U/mL 99.1 (H)  ?Vitamin D, 25-Hydroxy ?    30 - 100 ng/mL 31.27  ?ANA Ab, IFA ?     Positive (A)  ?CMV IgM ?    0.0 - 29.9 AU/mL <30.0  ?CMV Ab - IgG ?    0.00 - 0.59 U/mL 7.00 (H)  ? ?FANA Staining Patterns ?Order: [id number] ?Status: Final result   ?Visible to patient: Yes (seen)   ?Next appt: 01/08/2022 at 09:30 AM in Oncology ([facility])   ?0 Result Notes ?Component 3 wk ago   ?Speckled Pattern 2,4,5,29 High    ?Comment: 1:1280  ?ICAP nomenclature: AC   ?  ? ?SURGICAL PATHOLOGY  ?CASE: [id number]  ?PATIENT: Vaden Dauphin  ?Surgical Pathology Report  ? ? ? ? ?Clinical History: left axillary and generalized adenopathy  ? ? ?FINAL MICROSCOPIC DIAGNOSIS:  ? ?A. LYMPH NODE, LEFT AXILLARY, EXCISION:  ?-  Enlarged lymph nodes with progressive transformation of germinal  ?centers  ?-  No morphologic or immunophenotypic evidence of a lymphoproliferative  ?process  ?-  See comment  ? ?COMMENT:  ? ?The excision specimen consists of five lymph nodes of varying sizes.  ?Overall nodal architecture is preserved.  The most notable feature is  ?enlarged germinal centers with irregular margins.  By  ?immunohistochemistry, the germinal centers are positive for CD20, CD10,  ?BCL6, but negative for Bcl-2 and cyclin D1.  CD23 highlights expanded  ?follicular dendritic meshworks.  Ki-67 highlights preserved polarization  ?within a large portion of the  germinal centers.  EBV by in situ  ?hybridization is negative. CD30 highlights scattered enlarged  ?lymphocytes which do not express CD15, these are favored to represent  ?activated immunoblasts.  CD3 and C

## 2022-02-07 ENCOUNTER — Other Ambulatory Visit (HOSPITAL_COMMUNITY): Payer: Self-pay

## 2022-02-13 ENCOUNTER — Ambulatory Visit (HOSPITAL_COMMUNITY): Payer: 59

## 2022-02-14 ENCOUNTER — Ambulatory Visit (HOSPITAL_COMMUNITY)
Admission: RE | Admit: 2022-02-14 | Discharge: 2022-02-14 | Disposition: A | Discharge status: Home / Self Care | Payer: 59 | Source: Ambulatory Visit | Attending: Hematology | Admitting: Hematology

## 2022-02-14 DIAGNOSIS — K5730 Diverticulosis of large intestine without perforation or abscess without bleeding: Secondary | ICD-10-CM | POA: Diagnosis not present

## 2022-02-14 DIAGNOSIS — R591 Generalized enlarged lymph nodes: Secondary | ICD-10-CM | POA: Insufficient documentation

## 2022-02-14 DIAGNOSIS — C8590 Non-Hodgkin lymphoma, unspecified, unspecified site: Secondary | ICD-10-CM | POA: Diagnosis not present

## 2022-02-14 DIAGNOSIS — I700 Atherosclerosis of aorta: Secondary | ICD-10-CM | POA: Diagnosis not present

## 2022-02-14 DIAGNOSIS — K3189 Other diseases of stomach and duodenum: Secondary | ICD-10-CM | POA: Diagnosis not present

## 2022-02-14 DIAGNOSIS — R590 Localized enlarged lymph nodes: Secondary | ICD-10-CM | POA: Diagnosis not present

## 2022-02-14 MED ORDER — SODIUM CHLORIDE (PF) 0.9 % IJ SOLN
INTRAMUSCULAR | Status: AC
  Filled 2022-02-14: qty 50

## 2022-02-14 MED ORDER — IOHEXOL 300 MG/ML  SOLN
100.0000 mL | Freq: Once | INTRAMUSCULAR | Status: AC | PRN
  Administered 2022-02-14: 100 mL via INTRAVENOUS

## 2022-02-21 ENCOUNTER — Other Ambulatory Visit (HOSPITAL_COMMUNITY): Payer: Self-pay

## 2022-02-22 ENCOUNTER — Other Ambulatory Visit (HOSPITAL_COMMUNITY): Payer: Self-pay

## 2022-02-25 ENCOUNTER — Other Ambulatory Visit (HOSPITAL_COMMUNITY): Payer: Self-pay

## 2022-02-28 ENCOUNTER — Other Ambulatory Visit (HOSPITAL_COMMUNITY): Payer: Self-pay

## 2022-03-11 ENCOUNTER — Other Ambulatory Visit (HOSPITAL_COMMUNITY): Payer: Self-pay

## 2022-03-11 ENCOUNTER — Other Ambulatory Visit (HOSPITAL_COMMUNITY): Payer: Self-pay | Admitting: *Deleted

## 2022-03-11 MED ORDER — HYDRALAZINE HCL 50 MG PO TABS
50.0000 mg | ORAL_TABLET | Freq: Two times a day (BID) | ORAL | 3 refills | Status: DC
  Filled 2022-03-11: qty 180, 90d supply, fill #0
  Filled 2022-06-11: qty 180, 90d supply, fill #1
  Filled 2022-09-08: qty 180, 90d supply, fill #2

## 2022-03-11 MED ORDER — CARVEDILOL 6.25 MG PO TABS
ORAL_TABLET | ORAL | 3 refills | Status: DC
  Filled 2022-03-11: qty 180, 90d supply, fill #0
  Filled 2022-06-11: qty 180, 90d supply, fill #1
  Filled 2022-09-08: qty 180, 90d supply, fill #2

## 2022-04-17 ENCOUNTER — Other Ambulatory Visit: Payer: Self-pay | Admitting: Vascular Surgery

## 2022-04-17 DIAGNOSIS — M7989 Other specified soft tissue disorders: Secondary | ICD-10-CM

## 2022-04-18 ENCOUNTER — Ambulatory Visit (INDEPENDENT_AMBULATORY_CARE_PROVIDER_SITE_OTHER): Payer: 59

## 2022-04-18 ENCOUNTER — Other Ambulatory Visit: Payer: Self-pay | Admitting: *Deleted

## 2022-04-18 DIAGNOSIS — M7989 Other specified soft tissue disorders: Secondary | ICD-10-CM

## 2022-04-18 NOTE — Progress Notes (Signed)
Order for DVT study has been placed

## 2022-05-07 DIAGNOSIS — G4733 Obstructive sleep apnea (adult) (pediatric): Secondary | ICD-10-CM | POA: Diagnosis not present

## 2022-05-26 ENCOUNTER — Other Ambulatory Visit (HOSPITAL_COMMUNITY): Payer: Self-pay

## 2022-05-27 ENCOUNTER — Other Ambulatory Visit (HOSPITAL_COMMUNITY): Payer: Self-pay

## 2022-06-11 ENCOUNTER — Other Ambulatory Visit (HOSPITAL_COMMUNITY): Payer: Self-pay

## 2022-07-12 ENCOUNTER — Other Ambulatory Visit (HOSPITAL_COMMUNITY): Payer: Self-pay

## 2022-07-12 MED ORDER — AZITHROMYCIN 250 MG PO TABS
ORAL_TABLET | ORAL | 0 refills | Status: DC
  Filled 2022-07-12: qty 6, 5d supply, fill #0

## 2022-07-12 MED ORDER — ALBUTEROL SULFATE HFA 108 (90 BASE) MCG/ACT IN AERS
2.0000 | INHALATION_SPRAY | Freq: Four times a day (QID) | RESPIRATORY_TRACT | 0 refills | Status: DC | PRN
  Filled 2022-07-12: qty 6.7, 25d supply, fill #0

## 2022-08-05 DIAGNOSIS — G4733 Obstructive sleep apnea (adult) (pediatric): Secondary | ICD-10-CM | POA: Diagnosis not present

## 2022-08-18 DIAGNOSIS — Z8619 Personal history of other infectious and parasitic diseases: Secondary | ICD-10-CM | POA: Diagnosis not present

## 2022-08-21 ENCOUNTER — Other Ambulatory Visit (HOSPITAL_COMMUNITY): Payer: Self-pay | Admitting: Internal Medicine

## 2022-08-21 DIAGNOSIS — I10 Essential (primary) hypertension: Secondary | ICD-10-CM

## 2022-08-25 ENCOUNTER — Other Ambulatory Visit (HOSPITAL_COMMUNITY): Payer: Self-pay | Admitting: *Deleted

## 2022-08-25 ENCOUNTER — Other Ambulatory Visit (HOSPITAL_COMMUNITY): Payer: Self-pay

## 2022-08-25 DIAGNOSIS — I10 Essential (primary) hypertension: Secondary | ICD-10-CM

## 2022-08-25 MED ORDER — AMLODIPINE BESY-BENAZEPRIL HCL 10-40 MG PO CAPS
1.0000 | ORAL_CAPSULE | Freq: Every day | ORAL | 0 refills | Status: DC
  Filled 2022-08-25: qty 30, 30d supply, fill #0

## 2022-08-25 MED ORDER — AMLODIPINE BESY-BENAZEPRIL HCL 10-40 MG PO CAPS
1.0000 | ORAL_CAPSULE | Freq: Every day | ORAL | 2 refills | Status: DC
  Filled 2022-08-25: qty 90, 90d supply, fill #0

## 2022-09-08 ENCOUNTER — Other Ambulatory Visit (HOSPITAL_COMMUNITY): Payer: Self-pay

## 2022-09-10 DIAGNOSIS — Z8619 Personal history of other infectious and parasitic diseases: Secondary | ICD-10-CM | POA: Diagnosis not present

## 2022-10-23 ENCOUNTER — Encounter (HOSPITAL_COMMUNITY): Payer: Self-pay | Admitting: Internal Medicine

## 2022-10-23 ENCOUNTER — Ambulatory Visit (HOSPITAL_COMMUNITY)
Admission: RE | Admit: 2022-10-23 | Discharge: 2022-10-23 | Disposition: A | Discharge status: Home / Self Care | Payer: Commercial Managed Care - PPO | Source: Ambulatory Visit | Attending: Internal Medicine | Admitting: Internal Medicine

## 2022-10-23 ENCOUNTER — Other Ambulatory Visit (HOSPITAL_COMMUNITY): Payer: Self-pay

## 2022-10-23 VITALS — BP 122/70 | HR 79 | Wt 273.2 lb

## 2022-10-23 DIAGNOSIS — E785 Hyperlipidemia, unspecified: Secondary | ICD-10-CM

## 2022-10-23 DIAGNOSIS — G4733 Obstructive sleep apnea (adult) (pediatric): Secondary | ICD-10-CM

## 2022-10-23 DIAGNOSIS — E669 Obesity, unspecified: Secondary | ICD-10-CM | POA: Insufficient documentation

## 2022-10-23 DIAGNOSIS — I2510 Atherosclerotic heart disease of native coronary artery without angina pectoris: Secondary | ICD-10-CM | POA: Insufficient documentation

## 2022-10-23 DIAGNOSIS — Z720 Tobacco use: Secondary | ICD-10-CM

## 2022-10-23 DIAGNOSIS — Z79899 Other long term (current) drug therapy: Secondary | ICD-10-CM | POA: Diagnosis not present

## 2022-10-23 DIAGNOSIS — E876 Hypokalemia: Secondary | ICD-10-CM

## 2022-10-23 DIAGNOSIS — R0789 Other chest pain: Secondary | ICD-10-CM | POA: Diagnosis not present

## 2022-10-23 DIAGNOSIS — R0609 Other forms of dyspnea: Secondary | ICD-10-CM | POA: Insufficient documentation

## 2022-10-23 DIAGNOSIS — Z6835 Body mass index (BMI) 35.0-35.9, adult: Secondary | ICD-10-CM | POA: Insufficient documentation

## 2022-10-23 DIAGNOSIS — I10 Essential (primary) hypertension: Secondary | ICD-10-CM

## 2022-10-23 DIAGNOSIS — E782 Mixed hyperlipidemia: Secondary | ICD-10-CM

## 2022-10-23 LAB — COMPREHENSIVE METABOLIC PANEL
ALT: 29 U/L (ref 0–44)
AST: 26 U/L (ref 15–41)
Albumin: 3.6 g/dL (ref 3.5–5.0)
Alkaline Phosphatase: 102 U/L (ref 38–126)
Anion gap: 8 (ref 5–15)
BUN: 12 mg/dL (ref 6–20)
CO2: 23 mmol/L (ref 22–32)
Calcium: 8.6 mg/dL — ABNORMAL LOW (ref 8.9–10.3)
Chloride: 101 mmol/L (ref 98–111)
Creatinine, Ser: 0.99 mg/dL (ref 0.61–1.24)
GFR, Estimated: >60 mL/min (ref >60)
Glucose, Bld: 298 mg/dL — ABNORMAL HIGH (ref 70–99)
Potassium: 4.1 mmol/L (ref 3.5–5.1)
Sodium: 132 mmol/L — ABNORMAL LOW (ref 135–145)
Total Bilirubin: 0.6 mg/dL (ref 0.3–1.2)
Total Protein: 8 g/dL (ref 6.5–8.1)

## 2022-10-23 LAB — HEMOGLOBIN A1C
Hgb A1c MFr Bld: 10.1 % — ABNORMAL HIGH (ref 4.8–5.6)
Mean Plasma Glucose: 243.17 mg/dL

## 2022-10-23 LAB — LIPID PANEL
Cholesterol: 140 mg/dL (ref 0–200)
HDL: 55 mg/dL (ref >40)
LDL Cholesterol: 76 mg/dL (ref 0–99)
Total CHOL/HDL Ratio: 2.5 RATIO
Triglycerides: 44 mg/dL (ref <150)
VLDL: 9 mg/dL (ref 0–40)

## 2022-10-23 LAB — CBC
HCT: 34.1 % — ABNORMAL LOW (ref 39.0–52.0)
Hemoglobin: 11.3 g/dL — ABNORMAL LOW (ref 13.0–17.0)
MCH: 25.7 pg — ABNORMAL LOW (ref 26.0–34.0)
MCHC: 33.1 g/dL (ref 30.0–36.0)
MCV: 77.7 fL — ABNORMAL LOW (ref 80.0–100.0)
Platelets: 101 10*3/uL — ABNORMAL LOW (ref 150–400)
RBC: 4.39 MIL/uL (ref 4.22–5.81)
RDW: 16.4 % — ABNORMAL HIGH (ref 11.5–15.5)
WBC: 3.1 10*3/uL — ABNORMAL LOW (ref 4.0–10.5)
nRBC: 0 % (ref 0.0–0.2)

## 2022-10-23 LAB — MAGNESIUM: Magnesium: 1.9 mg/dL (ref 1.7–2.4)

## 2022-10-23 MED ORDER — LOSARTAN POTASSIUM 100 MG PO TABS
100.0000 mg | ORAL_TABLET | Freq: Every day | ORAL | 3 refills | Status: DC
  Filled 2022-10-23: qty 90, 90d supply, fill #0

## 2022-10-23 MED ORDER — AMLODIPINE BESYLATE 10 MG PO TABS
10.0000 mg | ORAL_TABLET | Freq: Every day | ORAL | 3 refills | Status: DC
  Filled 2022-10-23: qty 90, 90d supply, fill #0

## 2022-10-23 NOTE — Progress Notes (Signed)
CARDIOLOGY CLINIC NOTE  Patient ID: Mark Nichols, male   DOB: 09/04/1971, 52 y.o.   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 Underwent cardiac CT which showed normal coronaries in 9/12.  Could not tolerate atorva due to myalgias. Recently struggling with hypokalemia/hypomagnesemia/hypocalcemia felt to be due to PPI.  Followed by hematology for lymphadenopathy  He returns today for follow up.Feels ok. Having problems with ulnar neuropathy. Walking as time permits. Was recently in [place] and walked 19mles/day. No CP or SOB. Cut back on smoking "drastically"    Lab Results  Component Value Date   CHOL 177 06/11/2021   HDL 44 06/11/2021   LDLCALC 121 (H) 06/11/2021   LDLDIRECT 183.0 06/04/2011   TRIG 59 06/11/2021   CHOLHDL 4.0 06/11/2021    Review of systems complete and found to be negative unless listed in HPI.   Past Medical History:  Diagnosis Date   GERD (gastroesophageal reflux disease)    Hypercholesteremia    Hypertension    Pneumonia    Sleep apnea     Current Outpatient Medications  Medication Sig Dispense Refill   albuterol (VENTOLIN HFA) 108 (90 Base) MCG/ACT inhaler Inhale 2 puffs into the lungs every 6 (six) hours as needed for shortness of breath and wheezing 6.7 g 0   amLODipine-benazepril (LOTREL) 10-40 MG capsule Take 1 capsule by mouth daily. 90 capsule 2   carvedilol (COREG) 6.25 MG tablet Take 1 tablet by mouth twice daily. 180 tablet 3   famotidine (PEPCID) 20 MG tablet Take 20 mg by mouth 2 (two) times daily.     hydrALAZINE (APRESOLINE) 50 MG tablet Take 1 tablet (50 mg total) by mouth twice daily in the morning and at bedtime. 180 tablet 3   rosuvastatin (CRESTOR) 10 MG tablet Take 1 tablet (10 mg total) by mouth daily. Must have appointment for further refills 90 tablet 1   azithromycin (ZITHROMAX) 250 MG tablet Take 2 tablets by mouth on day 1, then 1 tablet daily  for 4 days (Patient not taking: Reported on 10/23/2022) 6 tablet 0   eplerenone (INSPRA) 50 MG tablet Take 1 tablet (50 mg total) by mouth daily. (Patient not taking: Reported on 10/23/2022) 30 tablet 3   Magnesium Oxide 200 MG TABS Take 1 tablet (200 mg total) by mouth in the morning and at bedtime. (Patient not taking: Reported on 01/15/2022) 60 tablet 3   potassium chloride (KLOR-CON) 10 MEQ tablet Take 4 tablets (40 mEq total) by mouth daily. (Patient not taking: Reported on 01/15/2022) 120 tablet 3   traMADol (ULTRAM) 50 MG tablet Take 2 tablets (100 mg total) by mouth every 6 (six) hours as needed. (Patient not taking: Reported on 01/15/2022) 8 tablet 0   No current facility-administered medications for this encounter.     PHYSICAL EXAM: Vitals:   10/23/22 0855  BP: 122/70  Pulse: 79  SpO2: 100%   Wt Readings from Last 3 Encounters:  10/23/22 123.9 kg (273 lb 4 oz)  01/15/22 122.5 kg (270 lb)  07/17/21 122.8 kg (270 lb 12.8 oz)   General:  Well appearing. No resp difficulty HEENT: normal Neck: supple. no JVD. Carotids 2+ bilat; no bruits. No lymphadenopathy or thryomegaly appreciated. Cor: PMI nondisplaced. Regular rate & rhythm. No rubs, gallops or murmurs. Lungs: clear Abdomen: obese soft, nontender, nondistended. No hepatosplenomegaly. No bruits or masses. Good bowel sounds. Extremities: no cyanosis, clubbing, rash, edema Neuro:  alert & orientedx3, cranial nerves grossly intact. moves all 4 extremities w/o difficulty. Affect pleasant   ECG: Sinus rhythm 70 No ST-T wave abnormalities. Personally reviewed   ASSESSMENT & PLAN:  1. HTN - BP well controlled - with risk of angioedema switch lotrel to amlodipine 10 and losartan 100  2. Hyperlipidemia - Essentially intolerant of Crestor due to myalgias. Now rechallenging with crestor 10  - Recehck lipids  3. Chest pressure and progressive exertional dyspnea - CTA 9/22 minimal non-obstructive CAD  4. OSA - Continue CPAP -  Follows with Dr Mark Nichols  4. Tobacco use - working on quitting  5. Obesity - Body mass index is 35.08 kg/m. - discussed possible GLP1RA    Mark Bickers, MD  9:12 AM

## 2022-10-23 NOTE — Addendum Note (Signed)
Encounter addended by: Jerl Mina, RN on: 10/23/2022 9:28 AM  Actions taken: Medication long-term status modified, Visit diagnoses modified, Pharmacy for encounter modified, Diagnosis association updated, Order list changed, Clinical Note Signed, Charge Capture section accepted

## 2022-10-23 NOTE — Patient Instructions (Addendum)
STOP Lotrel  START Losartan '100mg'$  daily.  START Amlodipine '10mg'$  daily  Labs done today, your results will be available in MyChart, we will contact you for abnormal readings.  Your physician recommends that you schedule a follow-up appointment in: 1 year ( January 2025)  ** please call the office in October to arrange your follow up appointment **  If you have any questions or concerns before your next appointment please send [place] a message through [place] or call our office at [phone].    TO LEAVE A MESSAGE FOR THE NURSE SELECT OPTION 2, PLEASE LEAVE A MESSAGE INCLUDING: YOUR NAME DATE OF BIRTH CALL BACK NUMBER REASON FOR CALL**this is important as we prioritize the call backs  YOU WILL RECEIVE A CALL BACK THE SAME DAY AS LONG AS YOU CALL BEFORE 4:00 PM  At the [facility], you and your health needs are our priority. As part of our continuing mission to provide you with exceptional heart care, we have created designated Provider Care Teams. These Care Teams include your primary Cardiologist (physician) and Advanced Practice Providers (APPs- Physician Assistants and Nurse Practitioners) who all work together to provide you with the care you need, when you need it.   You may see any of the following providers on your designated Care Team at your next follow up: Dr Glori Bickers Dr Loralie Champagne Dr. Roxana Hires, NP Lyda Jester, [place] [facility] [place], [place] Forestine Na, NP Audry Riles, PharmD   Please be sure to bring in all your medications bottles to every appointment.

## 2022-10-27 ENCOUNTER — Other Ambulatory Visit (HOSPITAL_COMMUNITY): Payer: Self-pay

## 2022-10-27 ENCOUNTER — Other Ambulatory Visit: Payer: Self-pay | Admitting: Hematology

## 2022-10-27 ENCOUNTER — Telehealth: Payer: Self-pay | Admitting: *Deleted

## 2022-10-27 DIAGNOSIS — D61818 Other pancytopenia: Secondary | ICD-10-CM

## 2022-10-27 MED ORDER — DAPAGLIFLOZIN PROPANEDIOL 10 MG PO TABS
10.0000 mg | ORAL_TABLET | Freq: Every day | ORAL | 6 refills | Status: DC
  Filled 2022-10-27: qty 30, 30d supply, fill #0
  Filled 2022-11-29: qty 30, 30d supply, fill #1
  Filled 2023-01-22: qty 30, 30d supply, fill #2
  Filled 2023-03-28: qty 30, 30d supply, fill #3

## 2022-10-27 NOTE — Telephone Encounter (Signed)
Per Dr Haroldine Laws, start Farxiga 10 mg Daily and f/u endocrinology for elevated glucose. Pt aware, rx sent in

## 2022-10-28 ENCOUNTER — Inpatient Hospital Stay: Payer: Commercial Managed Care - PPO | Attending: Hematology

## 2022-10-28 DIAGNOSIS — R591 Generalized enlarged lymph nodes: Secondary | ICD-10-CM | POA: Insufficient documentation

## 2022-10-28 DIAGNOSIS — I10 Essential (primary) hypertension: Secondary | ICD-10-CM | POA: Diagnosis not present

## 2022-10-28 DIAGNOSIS — D61818 Other pancytopenia: Secondary | ICD-10-CM

## 2022-10-28 LAB — CMP (CANCER CENTER ONLY)
ALT: 25 U/L (ref 0–44)
AST: 23 U/L (ref 15–41)
Albumin: 3.6 g/dL (ref 3.5–5.0)
Alkaline Phosphatase: 89 U/L (ref 38–126)
Anion gap: 5 (ref 5–15)
BUN: 14 mg/dL (ref 6–20)
CO2: 24 mmol/L (ref 22–32)
Calcium: 8.8 mg/dL — ABNORMAL LOW (ref 8.9–10.3)
Chloride: 102 mmol/L (ref 98–111)
Creatinine: 1 mg/dL (ref 0.61–1.24)
GFR, Estimated: >60 mL/min (ref >60)
Glucose, Bld: 238 mg/dL — ABNORMAL HIGH (ref 70–99)
Potassium: 4.2 mmol/L (ref 3.5–5.1)
Sodium: 131 mmol/L — ABNORMAL LOW (ref 135–145)
Total Bilirubin: 0.4 mg/dL (ref 0.3–1.2)
Total Protein: 8.2 g/dL — ABNORMAL HIGH (ref 6.5–8.1)

## 2022-10-28 LAB — FERRITIN: Ferritin: 218 ng/mL (ref 24–336)

## 2022-10-28 LAB — CBC WITH DIFFERENTIAL (CANCER CENTER ONLY)
Abs Immature Granulocytes: 0 10*3/uL (ref 0.00–0.07)
Basophils Absolute: 0 10*3/uL (ref 0.0–0.1)
Basophils Relative: 0 %
Eosinophils Absolute: 0.1 10*3/uL (ref 0.0–0.5)
Eosinophils Relative: 2 %
HCT: 33.8 % — ABNORMAL LOW (ref 39.0–52.0)
Hemoglobin: 11.6 g/dL — ABNORMAL LOW (ref 13.0–17.0)
Immature Granulocytes: 0 %
Lymphocytes Relative: 36 %
Lymphs Abs: 1.1 10*3/uL (ref 0.7–4.0)
MCH: 26 pg (ref 26.0–34.0)
MCHC: 34.3 g/dL (ref 30.0–36.0)
MCV: 75.8 fL — ABNORMAL LOW (ref 80.0–100.0)
Monocytes Absolute: 0.3 10*3/uL (ref 0.1–1.0)
Monocytes Relative: 9 %
Neutro Abs: 1.6 10*3/uL — ABNORMAL LOW (ref 1.7–7.7)
Neutrophils Relative %: 53 %
Platelet Count: 119 10*3/uL — ABNORMAL LOW (ref 150–400)
RBC: 4.46 MIL/uL (ref 4.22–5.81)
RDW: 16.3 % — ABNORMAL HIGH (ref 11.5–15.5)
WBC Count: 3.1 10*3/uL — ABNORMAL LOW (ref 4.0–10.5)
nRBC: 0 % (ref 0.0–0.2)

## 2022-10-28 LAB — IRON AND IRON BINDING CAPACITY (CC-WL,HP ONLY)
Iron: 40 ug/dL — ABNORMAL LOW (ref 45–182)
Saturation Ratios: 21 % (ref 17.9–39.5)
TIBC: 193 ug/dL — ABNORMAL LOW (ref 250–450)
UIBC: 153 ug/dL (ref 117–376)

## 2022-10-28 LAB — VITAMIN B12: Vitamin B-12: 375 pg/mL (ref 180–914)

## 2022-10-28 LAB — SEDIMENTATION RATE: Sed Rate: 25 mm/hr — ABNORMAL HIGH (ref 0–16)

## 2022-10-28 LAB — LACTATE DEHYDROGENASE: LDH: 119 U/L (ref 98–192)

## 2022-10-29 ENCOUNTER — Other Ambulatory Visit (HOSPITAL_COMMUNITY): Payer: Self-pay

## 2022-10-29 LAB — MISC LABCORP TEST (SEND OUT): Labcorp test code: 81950

## 2022-10-29 LAB — FOLATE RBC
Folate, Hemolysate: 300 ng/mL
Folate, RBC: 836 ng/mL (ref >498)
Hematocrit: 35.9 % — ABNORMAL LOW (ref 37.5–51.0)

## 2022-10-29 MED ORDER — METFORMIN HCL ER 500 MG PO TB24
500.0000 mg | ORAL_TABLET | Freq: Every day | ORAL | 6 refills | Status: DC
  Filled 2022-10-29: qty 60, 30d supply, fill #0
  Filled 2022-12-02: qty 60, 30d supply, fill #1
  Filled 2022-12-30: qty 60, 30d supply, fill #2
  Filled 2023-01-22 (×3): qty 60, 30d supply, fill #3

## 2022-10-31 ENCOUNTER — Other Ambulatory Visit (HOSPITAL_COMMUNITY): Payer: Self-pay

## 2022-11-02 LAB — COPPER, SERUM: Copper: 120 ug/dL (ref 69–132)

## 2022-11-04 DIAGNOSIS — G4733 Obstructive sleep apnea (adult) (pediatric): Secondary | ICD-10-CM | POA: Diagnosis not present

## 2022-11-05 ENCOUNTER — Other Ambulatory Visit (HOSPITAL_COMMUNITY): Payer: Self-pay

## 2022-11-05 ENCOUNTER — Other Ambulatory Visit: Payer: Self-pay | Admitting: Hematology

## 2022-11-05 MED ORDER — ERGOCALCIFEROL 1.25 MG (50000 UT) PO CAPS
50000.0000 [IU] | ORAL_CAPSULE | ORAL | 1 refills | Status: DC
  Filled 2022-11-05: qty 12, 84d supply, fill #0
  Filled 2022-12-09: qty 12, 84d supply, fill #1

## 2022-11-06 ENCOUNTER — Other Ambulatory Visit: Payer: Self-pay | Admitting: Hematology

## 2022-11-06 DIAGNOSIS — Z125 Encounter for screening for malignant neoplasm of prostate: Secondary | ICD-10-CM | POA: Diagnosis not present

## 2022-11-06 DIAGNOSIS — Z0000 Encounter for general adult medical examination without abnormal findings: Secondary | ICD-10-CM | POA: Diagnosis not present

## 2022-11-06 DIAGNOSIS — E663 Overweight: Secondary | ICD-10-CM | POA: Diagnosis not present

## 2022-11-06 DIAGNOSIS — E559 Vitamin D deficiency, unspecified: Secondary | ICD-10-CM | POA: Diagnosis not present

## 2022-11-06 DIAGNOSIS — E0869 Diabetes mellitus due to underlying condition with other specified complication: Secondary | ICD-10-CM | POA: Diagnosis not present

## 2022-11-06 DIAGNOSIS — G4733 Obstructive sleep apnea (adult) (pediatric): Secondary | ICD-10-CM | POA: Diagnosis not present

## 2022-11-06 DIAGNOSIS — R591 Generalized enlarged lymph nodes: Secondary | ICD-10-CM

## 2022-11-06 DIAGNOSIS — Z720 Tobacco use: Secondary | ICD-10-CM | POA: Diagnosis not present

## 2022-11-06 DIAGNOSIS — D61818 Other pancytopenia: Secondary | ICD-10-CM

## 2022-11-06 DIAGNOSIS — I10 Essential (primary) hypertension: Secondary | ICD-10-CM | POA: Diagnosis not present

## 2022-11-06 NOTE — Progress Notes (Unsigned)
I called and discussed all the lab workup done to evaluate Dr Grandville Silos pancytopenia with new RBC microcytosis and discussed all the results in details.  Component     Latest Ref Rng 10/28/2022  WBC     4.0 - 10.5 K/uL 3.1 (L)   RBC     4.22 - 5.81 MIL/uL 4.46   Hemoglobin     13.0 - 17.0 g/dL 11.6 (L)   HCT     37.5 - 51.0 % 35.9 (L)   HCT     39.0 - 52.0 % 33.8 (L)   MCV     80.0 - 100.0 fL 75.8 (L)   MCH     26.0 - 34.0 pg 26.0   MCHC     30.0 - 36.0 g/dL 34.3   RDW     11.5 - 15.5 % 16.3 (H)   Platelets     150 - 400 K/uL 119 (L)   nRBC     0.0 - 0.2 % 0.0   Neutrophils     % 53   NEUT#     1.7 - 7.7 K/uL 1.6 (L)   Lymphocytes     % 36   Lymphocyte #     0.7 - 4.0 K/uL 1.1   Monocytes Relative     % 9   Monocyte #     0.1 - 1.0 K/uL 0.3   Eosinophil     % 2   Eosinophils Absolute     0.0 - 0.5 K/uL 0.1   Basophil     % 0   Basophils Absolute     0.0 - 0.1 K/uL 0.0   Immature Granulocytes     % 0   Abs Immature Granulocytes     0.00 - 0.07 K/uL 0.00   Sodium     135 - 145 mmol/L 131 (L)   Potassium     3.5 - 5.1 mmol/L 4.2   Chloride     98 - 111 mmol/L 102   CO2     22 - 32 mmol/L 24   Glucose     70 - 99 mg/dL 238 (H)   BUN     6 - 20 mg/dL 14   Creatinine     0.61 - 1.24 mg/dL 1.00   Calcium     8.9 - 10.3 mg/dL 8.8 (L)   Total Protein     6.5 - 8.1 g/dL 8.2 (H)   Albumin     3.5 - 5.0 g/dL 3.6   AST     15 - 41 U/L 23   ALT     0 - 44 U/L 25   Alkaline Phosphatase     38 - 126 U/L 89   Total Bilirubin     0.3 - 1.2 mg/dL 0.4   GFR, Est Non African American     >60 mL/min >60   Anion gap     5 - 15  5   Iron     45 - 182 ug/dL 40 (L)   TIBC     250 - 450 ug/dL 193 (L)   Saturation Ratios     17.9 - 39.5 % 21   UIBC     117 - 376 ug/dL 153   [facility] test code [id number]   [facility] test name VITAMIN D 25 HYDROXY   Source ([facility]) SERUM   Misc [facility] result COMMENT   Folate, Hemolysate     Not Estab. ng/mL 300.0  Folate,  RBC     >498 ng/mL 836   Copper     69 - 132 ug/dL 120   Vitamin B12     180 - 914 pg/mL 375   Ferritin     24 - 336 ng/mL 218   Sed Rate     0 - 16 mm/hr 25 (H)   LDH     98 - 192 U/L 119     Legend: (L) Low (H) High   Assessment   1) Persistent Anterior mediastinal and bilateral subpectoral and axillary lymphadenopathy Incidentally noted on CT coronary angiogram No obvious constitutional symptoms. No obvious symptoms no recent viral infection or febrile illness. No obvious symptoms suggestive of autoimmune condition.  He did have some arthralgias and myalgias but these resolved with electrolyte replacement. Flow cytometry on left axillary lymph node on 07/17/2021 had shown A predominant CD10 positive B-cell population comprising 10% of all lymphocytes. Surgical pathology on excisional lymph node biopsy consisting of 5 lymph nodes showed overall normal architecture is preserved.  No morphologic or immunophenotypic evidence of lymphoproliferative process noted.  Enlarged lymph nodes with progressive transformation of germinal centers.  2) New pancytopenia   With microcytic anemia hemoglobin of 11.6 with new RBC microcytosis with MCV of 75.8 Thrombocytopenia with platelet counts of 100-119k New leukopenia with a WBC count of 3.1k and ANC of 1.6k. LDH is within normal limits at 119 but there is an elevation in sedimentation rate to 25. Iron labs show no iron deficiency Copper levels within normal limits at 120  3) newly diagnosed diabetes mellitus type 2 with uncontrolled hyperglycemia. 4) HTN 5) HLD 6) OSA on CPAP 7) Smoking 8) left adrenal gland nodular thickening  PLAN -I called the patient and discussed his lab results in details. -Though it is possible his newly diagnosed diabetes is causing metabolic derangements that can cause some inflammation we discussed concerns for possible low-grade lymphoma that was not noted on his previous biopsies but could explain his  other adenopathy or some other evolving inflammatory disorder. -Patient prefers to take a more conservative approach and we shall plan to get a PET CT scan to guide additional lymph node sampling if indicated to more definitively rule out an underlying low-grade lymphoma. -If his cytopenias do not resolve with control of his diabetes we might also need to consider bone marrow biopsy. -Care was discussed in detail with the patient. -We will get his PET CT scan in about 10 weeks with repeat labs and see him back in about 12 weeks. -This will allow for 1 year reimaging evaluation of his lymphadenopathy since his last imaging in April 2023.  Marland KitchenBrunetta Genera  MD MS

## 2022-11-10 ENCOUNTER — Other Ambulatory Visit (HOSPITAL_COMMUNITY): Payer: Self-pay

## 2022-11-29 ENCOUNTER — Other Ambulatory Visit (HOSPITAL_COMMUNITY): Payer: Self-pay

## 2022-12-02 ENCOUNTER — Other Ambulatory Visit (HOSPITAL_COMMUNITY): Payer: Self-pay

## 2022-12-04 DIAGNOSIS — H25813 Combined forms of age-related cataract, bilateral: Secondary | ICD-10-CM | POA: Diagnosis not present

## 2022-12-04 DIAGNOSIS — I10 Essential (primary) hypertension: Secondary | ICD-10-CM | POA: Diagnosis not present

## 2022-12-04 DIAGNOSIS — H11153 Pinguecula, bilateral: Secondary | ICD-10-CM | POA: Diagnosis not present

## 2022-12-04 DIAGNOSIS — Z9989 Dependence on other enabling machines and devices: Secondary | ICD-10-CM | POA: Diagnosis not present

## 2022-12-04 DIAGNOSIS — G4730 Sleep apnea, unspecified: Secondary | ICD-10-CM | POA: Diagnosis not present

## 2022-12-04 DIAGNOSIS — H0259 Other disorders affecting eyelid function: Secondary | ICD-10-CM | POA: Diagnosis not present

## 2022-12-04 DIAGNOSIS — B0052 Herpesviral keratitis: Secondary | ICD-10-CM | POA: Diagnosis not present

## 2022-12-04 DIAGNOSIS — H11823 Conjunctivochalasis, bilateral: Secondary | ICD-10-CM | POA: Diagnosis not present

## 2022-12-08 DIAGNOSIS — K219 Gastro-esophageal reflux disease without esophagitis: Secondary | ICD-10-CM | POA: Diagnosis not present

## 2022-12-08 DIAGNOSIS — I10 Essential (primary) hypertension: Secondary | ICD-10-CM | POA: Diagnosis not present

## 2022-12-08 DIAGNOSIS — Z1211 Encounter for screening for malignant neoplasm of colon: Secondary | ICD-10-CM | POA: Diagnosis not present

## 2022-12-09 ENCOUNTER — Other Ambulatory Visit (HOSPITAL_COMMUNITY): Payer: Self-pay

## 2022-12-09 MED ORDER — FREESTYLE LIBRE 2 SENSOR MISC
11 refills | Status: DC
  Filled 2022-12-09: qty 2, 28d supply, fill #0

## 2022-12-09 MED ORDER — FREESTYLE LIBRE 2 READER DEVI
1 refills | Status: DC
  Filled 2022-12-09: qty 1, 90d supply, fill #0

## 2022-12-09 MED ORDER — OZEMPIC (0.25 OR 0.5 MG/DOSE) 2 MG/3ML ~~LOC~~ SOPN
0.2500 mg | PEN_INJECTOR | SUBCUTANEOUS | 0 refills | Status: DC
  Filled 2022-12-09: qty 3, 28d supply, fill #0

## 2022-12-09 MED ORDER — OZEMPIC (0.25 OR 0.5 MG/DOSE) 2 MG/3ML ~~LOC~~ SOPN: 0.5000 mg | PEN_INJECTOR | SUBCUTANEOUS | 5 refills | Status: DC

## 2022-12-10 ENCOUNTER — Other Ambulatory Visit (HOSPITAL_COMMUNITY): Payer: Self-pay

## 2022-12-11 ENCOUNTER — Other Ambulatory Visit (HOSPITAL_COMMUNITY): Payer: Self-pay

## 2022-12-12 ENCOUNTER — Other Ambulatory Visit: Payer: Self-pay

## 2022-12-12 ENCOUNTER — Emergency Department (HOSPITAL_COMMUNITY)
Admission: EM | Admit: 2022-12-12 | Discharge: 2022-12-12 | Disposition: A | Discharge status: Home / Self Care | Payer: Commercial Managed Care - PPO | Attending: Emergency Medicine | Admitting: Emergency Medicine

## 2022-12-12 ENCOUNTER — Encounter (HOSPITAL_COMMUNITY): Payer: Self-pay

## 2022-12-12 ENCOUNTER — Other Ambulatory Visit (HOSPITAL_COMMUNITY): Payer: Self-pay

## 2022-12-12 DIAGNOSIS — I10 Essential (primary) hypertension: Secondary | ICD-10-CM | POA: Insufficient documentation

## 2022-12-12 DIAGNOSIS — R739 Hyperglycemia, unspecified: Secondary | ICD-10-CM | POA: Diagnosis not present

## 2022-12-12 DIAGNOSIS — Z79899 Other long term (current) drug therapy: Secondary | ICD-10-CM | POA: Diagnosis not present

## 2022-12-12 DIAGNOSIS — R55 Syncope and collapse: Secondary | ICD-10-CM | POA: Diagnosis not present

## 2022-12-12 DIAGNOSIS — R42 Dizziness and giddiness: Secondary | ICD-10-CM | POA: Diagnosis not present

## 2022-12-12 LAB — COMPREHENSIVE METABOLIC PANEL
ALT: 22 U/L (ref 0–44)
AST: 29 U/L (ref 15–41)
Albumin: 4.2 g/dL (ref 3.5–5.0)
Alkaline Phosphatase: 82 U/L (ref 38–126)
Anion gap: 9 (ref 5–15)
BUN: 18 mg/dL (ref 6–20)
CO2: 21 mmol/L — ABNORMAL LOW (ref 22–32)
Calcium: 8.6 mg/dL — ABNORMAL LOW (ref 8.9–10.3)
Chloride: 100 mmol/L (ref 98–111)
Creatinine, Ser: 1.1 mg/dL (ref 0.61–1.24)
GFR, Estimated: >60 mL/min (ref >60)
Glucose, Bld: 319 mg/dL — ABNORMAL HIGH (ref 70–99)
Potassium: 4.8 mmol/L (ref 3.5–5.1)
Sodium: 130 mmol/L — ABNORMAL LOW (ref 135–145)
Total Bilirubin: 1.1 mg/dL (ref 0.3–1.2)
Total Protein: 8.8 g/dL — ABNORMAL HIGH (ref 6.5–8.1)

## 2022-12-12 LAB — CBC WITH DIFFERENTIAL/PLATELET
Abs Immature Granulocytes: 0.02 10*3/uL (ref 0.00–0.07)
Basophils Absolute: 0 10*3/uL (ref 0.0–0.1)
Basophils Relative: 1 %
Eosinophils Absolute: 0 10*3/uL (ref 0.0–0.5)
Eosinophils Relative: 1 %
HCT: 35.7 % — ABNORMAL LOW (ref 39.0–52.0)
Hemoglobin: 11.6 g/dL — ABNORMAL LOW (ref 13.0–17.0)
Immature Granulocytes: 1 %
Lymphocytes Relative: 31 %
Lymphs Abs: 1.3 10*3/uL (ref 0.7–4.0)
MCH: 25.7 pg — ABNORMAL LOW (ref 26.0–34.0)
MCHC: 32.5 g/dL (ref 30.0–36.0)
MCV: 79.2 fL — ABNORMAL LOW (ref 80.0–100.0)
Monocytes Absolute: 0.6 10*3/uL (ref 0.1–1.0)
Monocytes Relative: 15 %
Neutro Abs: 2.1 10*3/uL (ref 1.7–7.7)
Neutrophils Relative %: 51 %
Platelets: 138 10*3/uL — ABNORMAL LOW (ref 150–400)
RBC: 4.51 MIL/uL (ref 4.22–5.81)
RDW: 16.6 % — ABNORMAL HIGH (ref 11.5–15.5)
WBC: 4.1 10*3/uL (ref 4.0–10.5)
nRBC: 0 % (ref 0.0–0.2)

## 2022-12-12 LAB — MAGNESIUM: Magnesium: 2.1 mg/dL (ref 1.7–2.4)

## 2022-12-12 LAB — CBG MONITORING, ED: Glucose-Capillary: 285 mg/dL — ABNORMAL HIGH (ref 70–99)

## 2022-12-12 MED ORDER — SODIUM CHLORIDE 0.9 % IV BOLUS
1000.0000 mL | Freq: Once | INTRAVENOUS | Status: AC
  Administered 2022-12-12: 1000 mL via INTRAVENOUS

## 2022-12-12 NOTE — Discharge Instructions (Signed)
Your history, exam, evaluation they were overall reassuring.  Your vital signs remained reassuring and you are not hypotensive again here.  We feel you are safe for discharge home after getting 2 L of fluids and without critical lab abnormalities.  Your glucose was elevated so please follow-up on this and speak to your physician about blood pressure medication titration.  If any symptoms change or worsen acutely, please return to the nearest emergency department.

## 2022-12-12 NOTE — ED Notes (Signed)
Increased acuity to level 3 by ESI scale

## 2022-12-12 NOTE — ED Provider Notes (Signed)
[facility] Provider Note   CSN: [id number] Arrival date & time: 12/12/22  [id number]     History  No chief complaint on file.   Taegen Molesky is a 52 y.o. male.  The history is provided by the patient and medical records. No language interpreter was used.  Near Syncope This is a new problem. The current episode started less than 1 hour ago. The problem occurs rarely. The problem has been rapidly improving. Pertinent negatives include no chest pain, no abdominal pain, no headaches and no shortness of breath. Nothing aggravates the symptoms. Nothing relieves the symptoms. He has tried nothing for the symptoms. The treatment provided no relief.       Home Medications Prior to Admission medications   Medication Sig Start Date End Date Taking? Authorizing Provider  albuterol (VENTOLIN HFA) 108 (90 Base) MCG/ACT inhaler Inhale 2 puffs into the lungs every 6 (six) hours as needed for shortness of breath and wheezing 07/12/22   Thurnell Lose, MD  amLODipine (NORVASC) 10 MG tablet Take 1 tablet (10 mg total) by mouth daily. 10/23/22   Bensimhon, [name], MD  azithromycin (ZITHROMAX) 250 MG tablet Take 2 tablets by mouth on day 1, then 1 tablet daily for 4 days Patient not taking: Reported on 10/23/2022 07/12/22   Thurnell Lose, MD  carvedilol (COREG) 6.25 MG tablet Take 1 tablet by mouth twice daily. 03/11/22   Bensimhon, [name], MD  Continuous Blood Gluc Receiver (FREESTYLE LIBRE 2 READER) DEVI Use as directed 12/08/22     Continuous Blood Gluc Sensor (FREESTYLE LIBRE 2 SENSOR) MISC Use as directed. Apply 1 sensor to upper arm every 14 days 12/08/22     dapagliflozin propanediol (FARXIGA) 10 MG TABS tablet Take 1 tablet (10 mg total) by mouth daily before breakfast. 10/27/22   Bensimhon, [name], MD  eplerenone (INSPRA) 50 MG tablet Take 1 tablet (50 mg total) by mouth daily. Patient not taking: Reported on 10/23/2022 06/14/21   Bensimhon,  [name], MD  ergocalciferol (VITAMIN D2) 1.25 MG (50000 UT) capsule Take 1 capsule (50,000 Units total) by mouth once a week. 11/05/22   Brunetta Genera, MD  famotidine (PEPCID) 20 MG tablet Take 20 mg by mouth 2 (two) times daily.    [provider]  hydrALAZINE (APRESOLINE) 50 MG tablet Take 1 tablet (50 mg total) by mouth twice daily in the morning and at bedtime. 03/11/22   Bensimhon, [name], MD  losartan (COZAAR) 100 MG tablet Take 1 tablet (100 mg total) by mouth daily. 10/23/22   Bensimhon, [name], MD  Magnesium Oxide 200 MG TABS Take 1 tablet (200 mg total) by mouth in the morning and at bedtime. Patient not taking: Reported on 01/15/2022 06/14/21   Bensimhon, [name], MD  metFORMIN (GLUCOPHAGE-XR) 500 MG 24 hr tablet Take 1 tablet (500 mg total) by mouth daily for 7 days then increase to 1 tablet 2 times a day 10/29/22     potassium chloride (KLOR-CON) 10 MEQ tablet Take 4 tablets (40 mEq total) by mouth daily. Patient not taking: Reported on 01/15/2022 06/14/21   Bensimhon, [name], MD  rosuvastatin (CRESTOR) 10 MG tablet Take 1 tablet (10 mg total) by mouth daily. Must have appointment for further refills 02/22/21 10/23/22  Bensimhon, [name], MD  Semaglutide,0.25 or 0.5MG /DOS, (OZEMPIC, 0.25 OR 0.5 MG/DOSE,) 2 MG/3ML SOPN Inject 0.5 mg into the skin once a week. 12/08/22     Semaglutide,0.25  or 0.5MG /DOS, (OZEMPIC, 0.25 OR 0.5 MG/DOSE,) 2 MG/3ML SOPN Inject 0.25 mg into the skin once a week for 4 weeks 12/08/22     traMADol (ULTRAM) 50 MG tablet Take 2 tablets (100 mg total) by mouth every 6 (six) hours as needed. Patient not taking: Reported on 01/15/2022 07/17/21   Rolm Bookbinder, MD      Allergies    Chloroquine    Review of Systems   Review of Systems  Constitutional:  Negative for chills, fatigue and fever.  HENT:  Negative for congestion.   Eyes:  Negative for visual disturbance.  Respiratory:  Negative for cough, chest tightness, shortness of breath and  wheezing.   Cardiovascular:  Positive for near-syncope. Negative for chest pain, palpitations and leg swelling.  Gastrointestinal:  Negative for abdominal distention, abdominal pain, constipation, diarrhea, nausea and vomiting.  Genitourinary:  Negative for dysuria and flank pain.  Musculoskeletal:  Negative for back pain, neck pain and neck stiffness.  Skin:  Negative for rash and wound.  Neurological:  Positive for light-headedness. Negative for dizziness, syncope, facial asymmetry, speech difficulty, weakness and headaches.  Psychiatric/Behavioral:  Negative for agitation and confusion.   All other systems reviewed and are negative.   Physical Exam Updated Vital Signs There were no vitals taken for this visit. Physical Exam Vitals and nursing note reviewed.  Constitutional:      General: He is not in acute distress.    Appearance: He is well-developed. He is not ill-appearing, toxic-appearing or diaphoretic.  HENT:     Head: Normocephalic and atraumatic.     Nose: No congestion or rhinorrhea.     Mouth/Throat:     Mouth: Mucous membranes are dry.     Pharynx: No oropharyngeal exudate or posterior oropharyngeal erythema.  Eyes:     Extraocular Movements: Extraocular movements intact.     Conjunctiva/sclera: Conjunctivae normal.     Pupils: Pupils are equal, round, and reactive to light.  Cardiovascular:     Rate and Rhythm: Normal rate and regular rhythm.     Heart sounds: No murmur heard. Pulmonary:     Effort: Pulmonary effort is normal. No respiratory distress.     Breath sounds: Normal breath sounds. No wheezing, rhonchi or rales.  Chest:     Chest wall: No tenderness.  Abdominal:     General: Abdomen is flat.     Palpations: Abdomen is soft.     Tenderness: There is no abdominal tenderness. There is no right CVA tenderness, left CVA tenderness, guarding or rebound.  Musculoskeletal:        General: No swelling or tenderness.     Cervical back: Neck supple. No  tenderness.     Right lower leg: No edema.     Left lower leg: No edema.  Skin:    General: Skin is warm and dry.     Capillary Refill: Capillary refill takes less than 2 seconds.     Coloration: Skin is not pale.     Findings: No erythema or rash.  Neurological:     General: No focal deficit present.     Mental Status: He is alert.     Sensory: No sensory deficit.     Motor: No weakness.  Psychiatric:        Mood and Affect: Mood normal.     ED Results / Procedures / Treatments   Labs (all labs ordered are listed, but only abnormal results are displayed) Labs Reviewed  CBC WITH DIFFERENTIAL/PLATELET - Abnormal;  Notable for the following components:      Result Value   Hemoglobin 11.6 (*)    HCT 35.7 (*)    MCV 79.2 (*)    MCH 25.7 (*)    RDW 16.6 (*)    Platelets 138 (*)    All other components within normal limits  COMPREHENSIVE METABOLIC PANEL - Abnormal; Notable for the following components:   Sodium 130 (*)    CO2 21 (*)    Glucose, Bld 319 (*)    Calcium 8.6 (*)    Total Protein 8.8 (*)    All other components within normal limits  CBG MONITORING, ED - Abnormal; Notable for the following components:   Glucose-Capillary 285 (*)    All other components within normal limits  MAGNESIUM    EKG EKG Interpretation  Date/Time:  Friday December 12 2022 09:02:51 EDT Ventricular Rate:  96 PR Interval:  209 QRS Duration: 95 QT Interval:  330 QTC Calculation: 417 R Axis:   -41 Text Interpretation: Sinus rhythm Prolonged PR interval Probable left atrial enlargement Left axis deviation Low voltage, precordial leads Abnormal inferior Q waves when compared to prior, slightly faster rate than prior. No STEMI Confirmed by Antony Blackbird [phone]) on 12/12/2022 9:07:15 AM  Radiology No results found.  Procedures Procedures    Medications Ordered in ED Medications  sodium chloride 0.9 % bolus 1,000 mL (0 mLs Intravenous Stopped 12/12/22 1128)  sodium chloride 0.9 % bolus  1,000 mL (0 mLs Intravenous Stopped 12/12/22 1229)    ED Course/ Medical Decision Making/ A&P                             Medical Decision Making Amount and/or Complexity of Data Reviewed Labs: ordered.    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  According to patient, he has loss about 20 pounds in the last few months with dietary and lifestyle changes.  He said he was on for blood pressure medicines initially and now is only down to 2.  He did take this morning.  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  He denied any room spinning or vertiginous symptoms and denied any neurologic deficits such as double vision, speech difficulties or extremity symptoms.  He was able to sit down and check his blood pressure was around 100.  This is reportedly lower for him.  He said that he did eat a small amount of breakfast this morning and has not yet checked his glucose.  He denied any chest pain, palpitations, or shortness of breath with the episode.  Denied any nausea, vomiting, constipation, diarrhea, or urinary changes.  Denies any viral URI symptoms.  He suspects he was slightly dehydrated and his blood pressure medication made him slightly hypotensive and caused this episode.  On exam, lungs were clear.  Chest nontender.  Abdomen nontender.  No murmur.  Back nontender.  Patient has symmetric smile and clear speech.  Pupils are symmetric and reactive.  Patient well-appearing.  Some dry mucous membranes.  Will give a bag of fluid after shared system and conversation with patient and will check some basic labs and EKG and keep him on the monitor.  Suspect some dehydration and orthostasis related to blood  pressure medications.  Given his lack of any chest pain or palpitations we  will hold on extensive cardiac workup at this time and he did not fall and hit his head, will hold on head imaging.  Anticipate shared decision conversation after reassessment.  11:23 AM Rechecked on Dr. Grandville Silos and he is feeling better.  Blood pressure in the 120s.  He still feels dehydrated so we will give a second liter of fluids.  With all of his labs with him and his glucose is elevated which he takes metformin for.  He will continue to monitor this.  EKG does not show STEMI or critical arrhythmia.  We went over this together with him.  After second liter, if he is feeling better, anticipate discharge with outpatient follow-up instructions.        Final Clinical Impression(s) / ED Diagnoses Final diagnoses:  Near syncope  Lightheadedness    Rx / DC Orders ED Discharge Orders     None       Clinical Impression: 1. Near syncope   2. Lightheadedness     Disposition: Discharge  Condition: Good  I have discussed the results, Dx and Tx plan with the pt(& family if present). He/she/they expressed understanding and agree(s) with the plan. Discharge instructions discussed at great length. Strict return precautions discussed and pt &/or family have verbalized understanding of the instructions. No further questions at time of discharge.    New Prescriptions   No medications on file    Follow Up: Seward Carol, MD [address] [address] [place] [facility] [address] [id number] [place]       Angelia Hazell, Gwenyth Allegra, MD 12/12/22 1248

## 2022-12-12 NOTE — ED Triage Notes (Signed)
Was at work walking when he suddenly felt dizzy/lightheaded, patient then sat down and felt better. Takes 4 BP medications this morning. Recent 20lb weight loss. Denies N/V.

## 2022-12-12 NOTE — ED Notes (Signed)
ED Provider at bedside. 

## 2022-12-13 ENCOUNTER — Other Ambulatory Visit (HOSPITAL_COMMUNITY): Payer: Self-pay

## 2022-12-15 ENCOUNTER — Other Ambulatory Visit (HOSPITAL_COMMUNITY): Payer: Self-pay

## 2022-12-15 MED ORDER — LOSARTAN POTASSIUM 25 MG PO TABS
25.0000 mg | ORAL_TABLET | Freq: Every day | ORAL | 1 refills | Status: DC
  Filled 2022-12-15: qty 90, 90d supply, fill #0
  Filled 2023-03-28: qty 90, 90d supply, fill #1

## 2023-01-05 ENCOUNTER — Other Ambulatory Visit (HOSPITAL_COMMUNITY): Payer: Self-pay

## 2023-01-16 ENCOUNTER — Inpatient Hospital Stay: Payer: Commercial Managed Care - PPO

## 2023-01-16 ENCOUNTER — Other Ambulatory Visit (HOSPITAL_COMMUNITY): Payer: Self-pay

## 2023-01-16 ENCOUNTER — Telehealth: Payer: Self-pay | Admitting: Hematology

## 2023-01-16 MED ORDER — CLENPIQ 10-3.5-12 MG-GM -GM/175ML PO SOLN
175.0000 mL | ORAL | 0 refills | Status: DC
  Filled 2023-01-16: qty 350, 1d supply, fill #0

## 2023-01-22 ENCOUNTER — Other Ambulatory Visit (HOSPITAL_COMMUNITY): Payer: Self-pay

## 2023-01-22 ENCOUNTER — Other Ambulatory Visit: Payer: Self-pay

## 2023-01-26 ENCOUNTER — Other Ambulatory Visit (HOSPITAL_COMMUNITY): Payer: Self-pay

## 2023-01-29 DIAGNOSIS — K5730 Diverticulosis of large intestine without perforation or abscess without bleeding: Secondary | ICD-10-CM | POA: Diagnosis not present

## 2023-01-29 DIAGNOSIS — R12 Heartburn: Secondary | ICD-10-CM | POA: Diagnosis not present

## 2023-01-29 DIAGNOSIS — K219 Gastro-esophageal reflux disease without esophagitis: Secondary | ICD-10-CM | POA: Diagnosis not present

## 2023-01-29 DIAGNOSIS — Z1211 Encounter for screening for malignant neoplasm of colon: Secondary | ICD-10-CM | POA: Diagnosis not present

## 2023-01-29 DIAGNOSIS — K635 Polyp of colon: Secondary | ICD-10-CM | POA: Diagnosis not present

## 2023-01-29 LAB — HM COLONOSCOPY

## 2023-01-30 ENCOUNTER — Inpatient Hospital Stay: Payer: Commercial Managed Care - PPO | Admitting: Hematology

## 2023-02-02 ENCOUNTER — Other Ambulatory Visit: Payer: Self-pay

## 2023-02-02 ENCOUNTER — Inpatient Hospital Stay: Payer: Commercial Managed Care - PPO | Attending: Hematology

## 2023-02-02 ENCOUNTER — Encounter (HOSPITAL_COMMUNITY)
Admission: RE | Admit: 2023-02-02 | Discharge: 2023-02-02 | Disposition: A | Discharge status: Home / Self Care | Payer: Commercial Managed Care - PPO | Source: Ambulatory Visit | Attending: Hematology | Admitting: Hematology

## 2023-02-02 DIAGNOSIS — D61818 Other pancytopenia: Secondary | ICD-10-CM | POA: Insufficient documentation

## 2023-02-02 DIAGNOSIS — F17210 Nicotine dependence, cigarettes, uncomplicated: Secondary | ICD-10-CM | POA: Diagnosis not present

## 2023-02-02 DIAGNOSIS — I10 Essential (primary) hypertension: Secondary | ICD-10-CM | POA: Insufficient documentation

## 2023-02-02 DIAGNOSIS — C8598 Non-Hodgkin lymphoma, unspecified, lymph nodes of multiple sites: Secondary | ICD-10-CM | POA: Diagnosis not present

## 2023-02-02 DIAGNOSIS — R591 Generalized enlarged lymph nodes: Secondary | ICD-10-CM | POA: Insufficient documentation

## 2023-02-02 LAB — VITAMIN D 25 HYDROXY (VIT D DEFICIENCY, FRACTURES): Vit D, 25-Hydroxy: 65.82 ng/mL (ref 30–100)

## 2023-02-02 LAB — CBC WITH DIFFERENTIAL (CANCER CENTER ONLY)
Abs Immature Granulocytes: 0.01 10*3/uL (ref 0.00–0.07)
Basophils Absolute: 0 10*3/uL (ref 0.0–0.1)
Basophils Relative: 1 %
Eosinophils Absolute: 0.1 10*3/uL (ref 0.0–0.5)
Eosinophils Relative: 2 %
HCT: 36.1 % — ABNORMAL LOW (ref 39.0–52.0)
Hemoglobin: 11.6 g/dL — ABNORMAL LOW (ref 13.0–17.0)
Immature Granulocytes: 0 %
Lymphocytes Relative: 30 %
Lymphs Abs: 1.1 10*3/uL (ref 0.7–4.0)
MCH: 24.8 pg — ABNORMAL LOW (ref 26.0–34.0)
MCHC: 32.1 g/dL (ref 30.0–36.0)
MCV: 77.1 fL — ABNORMAL LOW (ref 80.0–100.0)
Monocytes Absolute: 0.3 10*3/uL (ref 0.1–1.0)
Monocytes Relative: 7 %
Neutro Abs: 2.2 10*3/uL (ref 1.7–7.7)
Neutrophils Relative %: 60 %
Platelet Count: 160 10*3/uL (ref 150–400)
RBC: 4.68 MIL/uL (ref 4.22–5.81)
RDW: 16.5 % — ABNORMAL HIGH (ref 11.5–15.5)
WBC Count: 3.7 10*3/uL — ABNORMAL LOW (ref 4.0–10.5)
nRBC: 0 % (ref 0.0–0.2)

## 2023-02-02 LAB — CMP (CANCER CENTER ONLY)
ALT: 11 U/L (ref 0–44)
AST: 14 U/L — ABNORMAL LOW (ref 15–41)
Albumin: 3.6 g/dL (ref 3.5–5.0)
Alkaline Phosphatase: 96 U/L (ref 38–126)
Anion gap: 5 (ref 5–15)
BUN: 9 mg/dL (ref 6–20)
CO2: 26 mmol/L (ref 22–32)
Calcium: 8.6 mg/dL — ABNORMAL LOW (ref 8.9–10.3)
Chloride: 104 mmol/L (ref 98–111)
Creatinine: 0.82 mg/dL (ref 0.61–1.24)
GFR, Estimated: >60 mL/min (ref >60)
Glucose, Bld: 159 mg/dL — ABNORMAL HIGH (ref 70–99)
Potassium: 3.7 mmol/L (ref 3.5–5.1)
Sodium: 135 mmol/L (ref 135–145)
Total Bilirubin: 0.4 mg/dL (ref 0.3–1.2)
Total Protein: 8.1 g/dL (ref 6.5–8.1)

## 2023-02-02 LAB — LACTATE DEHYDROGENASE: LDH: 131 U/L (ref 98–192)

## 2023-02-02 LAB — GLUCOSE, CAPILLARY: Glucose-Capillary: 140 mg/dL — ABNORMAL HIGH (ref 70–99)

## 2023-02-02 LAB — SEDIMENTATION RATE: Sed Rate: 26 mm/hr — ABNORMAL HIGH (ref 0–16)

## 2023-02-02 MED ORDER — FLUDEOXYGLUCOSE F - 18 (FDG) INJECTION
12.5200 | Freq: Once | INTRAVENOUS | Status: AC
  Administered 2023-02-02: 12.52 via INTRAVENOUS

## 2023-02-10 ENCOUNTER — Inpatient Hospital Stay (HOSPITAL_BASED_OUTPATIENT_CLINIC_OR_DEPARTMENT_OTHER): Payer: Commercial Managed Care - PPO | Admitting: Hematology

## 2023-02-10 DIAGNOSIS — R591 Generalized enlarged lymph nodes: Secondary | ICD-10-CM

## 2023-02-10 NOTE — Progress Notes (Signed)
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  Patient's last visit with me was on 01/15/2022 and he was doing well overall. I also had a phone visit with the patient on 11/06/2022.   .I connected with Mark Nichols on 02/10/2023 at  8:40 AM EDT by telephone visit and verified that I am speaking with the correct person using two identifiers.   Patient reports he has been doing well overall without any new or significant medical concerns since our last visit. He reports he has seasonal allergies and is regularly taking Claritin. Patient also complains of mild occasional joint pain.   He denies fever, chills, night sweats, unexpected weight loss, bone pain, abdominal pain, enlarged lymph nodes, new lump/bump, back pain, chest pain, or leg swelling.   Patient regularly follows-up with his PCP, Dr. Renford Nichols, regarding elevated glucose levels.   Discussed recent lab results and PET scan results with the patient.  I discussed the limitations, risks, security and privacy concerns of performing an evaluation and management service by telemedicine and the availability of in-person appointments. I also discussed with the patient that there may be a patient responsible charge related to this service. The patient expressed understanding and agreed to proceed.   Other persons participating in the visit and their role in the encounter: None    Patient's location: Home  Provider's location: [facility]   Chief Complaint: Lymphadenopathy    MEDICAL HISTORY:  Past Medical History:  Diagnosis Date   GERD (gastroesophageal reflux disease)    Hypercholesteremia    Hypertension    Pneumonia    Sleep apnea   Hypertension Dyslipidemia Obstructive [name] GERD Left adrenal possible adenoma Glucose intolerance Obesity .There is no height or weight on file to calculate BMI. Tobacco use  SURGICAL HISTORY: Past Surgical History:  Procedure Laterality Date   AXILLARY LYMPH NODE BIOPSY Left 07/17/2021   Procedure: LEFT AXILLARY LYMPH NODE BIOPSY;  Surgeon: Emelia Loron, MD;  [name];  Service: General;  Laterality: Left;    SOCIAL HISTORY: Social History   Socioeconomic History   Marital status: Married    Spouse name: Not on file   Number of children: Not on file   Years of education: Not on file   Highest education level: Not on file  Occupational History   Not on file  Tobacco Use   Smoking status: Some Days   Smokeless tobacco: Never   Tobacco comments:    Patient smokes rarely   Vaping Use   Vaping Use: Never used  Substance and Sexual Activity   Alcohol use: Yes    Comment: socailly   Drug use: No   Sexual activity: Not on file  Other Topics Concern   Not on file  Social History Narrative   Not on file   Social Determinants of Health   Financial Resource Strain: Not on file  Food Insecurity: Not on file  Transportation Needs: Not on file  Physical Activity: Not on file  Stress: Not on file  Social Connections: Not on file  Intimate Partner Violence: Not on file    FAMILY HISTORY: Mother had a history of some liver disease  ALLERGIES:  is allergic to chloroquine.  MEDICATIONS:  Current Outpatient Medications  Medication Sig Dispense Refill   albuterol (VENTOLIN HFA) 108 (90 Base) MCG/ACT inhaler Inhale 2 puffs into the lungs every 6 (six) hours as needed for  shortness of breath and wheezing 6.7 g 0   amLODipine (NORVASC) 10 MG tablet Take 1 tablet (10 mg total) by mouth daily. 90 tablet 3   azithromycin (ZITHROMAX) 250 MG tablet Take 2 tablets by mouth on day 1, then 1 tablet daily for 4 days (Patient not taking: Reported on 10/23/2022) 6 tablet 0   carvedilol (COREG) 6.25 MG tablet Take 1 tablet by mouth twice daily. 180 tablet 3   Continuous Blood Gluc Receiver (FREESTYLE LIBRE 2 READER) DEVI Use as directed 1 each 1   Continuous Blood Gluc Sensor (FREESTYLE LIBRE 2 SENSOR) MISC Use as directed. Apply 1 sensor to upper arm every 14 days 2 each 11   dapagliflozin propanediol (FARXIGA) 10 MG TABS tablet Take 1 tablet (10 mg total) by mouth daily before breakfast. 30 tablet 6   eplerenone (INSPRA) 50 MG tablet Take 1 tablet (50 mg total) by mouth daily. (Patient not taking: Reported on 10/23/2022) 30 tablet 3   ergocalciferol (VITAMIN D2) 1.25 MG (50000 UT) capsule Take 1 capsule (50,000 Units total) by mouth once a week. 12 capsule 1   famotidine (PEPCID) 20 MG tablet Take 20 mg by mouth 2 (two) times daily.     hydrALAZINE (APRESOLINE) 50 MG tablet Take 1 tablet (50 mg total) by mouth twice daily in the morning and at bedtime. 180 tablet 3   losartan (COZAAR) 100 MG tablet Take 1 tablet (100 mg total) by mouth daily. 90 tablet 3   losartan (COZAAR) 25 MG tablet Take 1 tablet (25 mg total) by mouth daily. 90 tablet 1   Magnesium Oxide 200 MG TABS Take 1 tablet (200 mg total) by mouth in the morning and at bedtime. (Patient not taking: Reported on 01/15/2022) 60 tablet 3   metFORMIN (GLUCOPHAGE-XR) 500 MG 24 hr tablet Take 1 tablet (500 mg total) by mouth daily for 7 days then increase to 1 tablet 2 times a day 60 tablet 6   potassium chloride (KLOR-CON) 10 MEQ tablet Take 4 tablets (40 mEq total) by mouth daily. (Patient not taking: Reported on 01/15/2022) 120 tablet 3   rosuvastatin (CRESTOR) 10 MG tablet Take 1 tablet (10 mg total) by mouth daily. Must  have appointment for further refills 90 tablet 1   Semaglutide,0.25 or 0.5MG /DOS, (OZEMPIC, 0.25 OR 0.5 MG/DOSE,) 2 MG/3ML SOPN Inject 0.5 mg into the skin once a week. 6 mL 5   Semaglutide,0.25 or 0.5MG /DOS, (OZEMPIC, 0.25 OR 0.5 MG/DOSE,) 2 MG/3ML SOPN Inject 0.25 mg into the skin once a week for 4 weeks then increase to 0.5mg  weekly 3 mL 0   Sod Picosulfate-Mag Ox-Cit Acd (CLENPIQ) 10-3.5-12 MG-GM -GM/175ML SOLN Take 175 mLs by mouth twice as directed 350 mL 0   traMADol (ULTRAM) 50 MG tablet Take 2 tablets (100 mg total) by mouth every 6 (six) hours as needed. (Patient not taking: Reported on 01/15/2022) 8 tablet 0   No current facility-administered medications for this visit.    REVIEW OF SYSTEMS:   10 Point review of Systems was done is negative except as noted above.   PHYSICAL EXAMINATION: TELE-MED VISIT  LABORATORY DATA:  I have reviewed the data as listed  .    Latest Ref Rng & Units 02/02/2023    9:33 AM 12/12/2022    9:24 AM 10/28/2022   10:29 AM  CBC  WBC 4.0 - 10.5 K/uL 3.7  4.1  3.1   Hemoglobin 13.0 - 17.0 g/dL 29.5  62.1  30.8   Hematocrit 39.0 - 52.0 % 36.1  35.7  35.9    33.8   Platelets 150 - 400 K/uL 160  138  119     .    Latest Ref Rng & Units 02/02/2023    9:33 AM 12/12/2022    9:24 AM 10/28/2022   10:29 AM  CMP  Glucose 70 - 99 mg/dL 657  846  962   BUN 6 - 20 mg/dL 9  18  14    Creatinine 0.61 - 1.24 mg/dL 9.52  8.41  3.24   Sodium 135 - 145 mmol/L 135  130  131   Potassium 3.5 - 5.1 mmol/L 3.7  4.8  4.2   Chloride 98 - 111 mmol/L 104  100  102   CO2 22 - 32 mmol/L 26  21  24    Calcium 8.9 - 10.3 mg/dL 8.6  8.6  8.8   Total Protein 6.5 - 8.1 g/dL 8.1  8.8  8.2   Total Bilirubin 0.3 - 1.2 mg/dL 0.4  1.1  0.4   Alkaline Phos 38 - 126 U/L 96  82  89   AST 15 - 41 U/L 14  29  23    ALT 0 - 44 U/L 11  22  25     . Lab Results  Component Value Date   LDH 131 02/02/2023     Component     Latest Ref Rng & Units 07/09/2021  Speckled Pattern       2,4,5,29 (H)  NOTE:      Comment  LDH     98 - 192 U/L 160  Magnesium     1.7 - 2.4 mg/dL 1.9  HCV Ab     0.0 - 0.9 s/co ratio <0.1 (A)  Hepatitis B Surface Ag     NON REACTIVE NON REACTIVE  Hep B Core Total Ab     NON REACTIVE NON REACTIVE  HIV Screen 4th Generation wRfx     Non Reactive Non Reactive  EBV VCA IgM     0.0 - 35.9 U/mL <36.0  EBV VCA IgG     0.0 - 17.9 U/mL 99.1 (H)  Vitamin D, 25-Hydroxy     30 - 100 ng/mL 31.27  ANA Ab, IFA      Positive (A)  CMV IgM     0.0 - 29.9 AU/mL <30.0  CMV Ab - IgG     0.00 - 0.59 U/mL 7.00 (H)   FANA Staining Patterns Order: [id number] Status: Final result   Visible to patient: Yes (seen)   Next appt: 01/08/2022 at 09:30 AM in Oncology ([facility])   0 Result Notes Component 3 wk ago   Speckled Pattern 2,4,5,29 High    Comment: 1:1280  ICAP nomenclature: [facility]      SURGICAL PATHOLOGY  CASE: [id number]  PATIENT: Avondre Summey  Surgical Pathology Report      Clinical History: left axillary and generalized adenopathy    FINAL MICROSCOPIC DIAGNOSIS:   A. LYMPH NODE, LEFT AXILLARY, EXCISION:  -  Enlarged lymph nodes with progressive transformation of germinal  centers  -  No morphologic or  immunophenotypic evidence of a lymphoproliferative  process  -  See comment   COMMENT:   The excision specimen consists of five lymph nodes of varying sizes.  Overall nodal architecture is preserved.  The most notable feature is  enlarged germinal centers with irregular margins.  By  immunohistochemistry, the germinal centers are positive for CD20, CD10,  BCL6, but negative for Bcl-2 and cyclin D1.  CD23 highlights expanded  follicular dendritic meshworks.  Ki-67 highlights preserved polarization  within a large portion of the germinal centers.  EBV by in situ  hybridization is negative. CD30 highlights scattered enlarged  lymphocytes which do not express CD15, these are favored to represent  activated immunoblasts.   CD3 and CD5 highlight background T cells.  CD68  highlights histiocytes.  Flow cytometry performed on the sample (see  [id number]) identified a kappa-predominant CD10 positive B-cell  population that comprised 10% of all lymphocytes.  The significance of  this is uncertain given the absence of morphologic features.   Overall, the findings are favored to represent progressive  transformation of germinal centers.  There are no definitive features  present to suggest a lymphoproliferative process; however, given the  presence of a kappa predominant population by flow cytometry, B-cell  clonality studies and FISH t(14;18)) are pending and will be reported in  an addendum.  Dr. Lidia Collum (hematopathologist) reviewed the case and  agrees with the above diagnosis.    RADIOGRAPHIC STUDIES: I have personally reviewed the radiological images as listed and agreed with the findings in the report. NM PET Image Initial (PI) Skull Base To Thigh  Result Date: 02/04/2023 CLINICAL DATA:  Initial treatment strategy for non-Hodgkin's lymphoma. EXAM: NUCLEAR MEDICINE PET SKULL BASE TO THIGH TECHNIQUE: 12.52 mCi F-18 FDG was injected intravenously. Full-ring PET imaging was performed from the skull base to thigh after the radiotracer. CT data was obtained and used for attenuation correction and anatomic localization. Fasting blood glucose: 140 mg/dl COMPARISON:  CT scan 46/96/2952 FINDINGS: Mediastinal blood pool activity: SUV max 2.36 Liver activity: SUV max 3.27 NECK: Moderate diffuse symmetric hypermetabolism in wall dire jarring. SUV max is 8.27. This can be a normal finding. No cervical lymphadenopathy. Incidental CT findings: None. CHEST: Scattered borderline enlarged axillary lymph nodes bilaterally appear relatively stable in size when compared to the prior CT scan from May 2023. These nodes demonstrate mild hypermetabolism. 11 mm left axillary node on image 15/2 has an SUV max of 4.56. 12 mm right axillary node  on image 61/4 has an SUV max of 3.37. Right subpectoral node measures 11 mm on image 49/4 and has an SUV max of 3.48. 15 mm node on image 61/4 has an SUV max of 3.04 and more anteriorly on image 63/4 is a 13 mm node which has an SUV max of 4.3 a. right subcarinal node measures 10 mm on image 73/4 and has an SUV max of 3.11. No worrisome pulmonary lesions or nodules. Incidental CT findings: Scattered age advanced coronary artery calcifications. Small hiatal hernia. ABDOMEN/PELVIS: No abnormal hypermetabolic activity within the liver, pancreas, adrenal glands, or spleen. No hypermetabolic lymph nodes in the abdomen. There are mildly enlarged pelvic lymph nodes. These appears stable. 13 mm left obturator node on image 170/4 has an SUV max of 3.81. Right-sided 9 mm node has an SUV max of 3.22. Bilateral borderline enlarged inguinal lymph nodes appears stable. 13 mm left inguinal node on image 183/4 has an SUV max of 3.22. Incidental CT findings: Scattered age advanced aortic calcifications but  no aneurysm. No splenic enlargement. SKELETON: No findings for osseous involvement. Incidental CT findings: None. IMPRESSION: 1. Stable borderline enlarged axillary, subpectoral, prevascular, subcarinal, pelvic and inguinal lymph nodes with mild hypermetabolism (Deauville 3 and 4). 2. No findings for solid organ disease or osseous involvement. 3. Age advanced coronary artery calcifications and aortic atherosclerosis. Aortic Atherosclerosis (ICD10-I70.0). Electronically Signed   By: Rudie Meyer M.D.   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 No obvious symptoms no recent viral infection or febrile illness. No obvious symptoms suggestive of autoimmune condition.  He did have some arthralgias and myalgias but these  resolved with electrolyte replacement.  2) HTN 3) HLD 4) OSA on CPAP 5) Smoking 6) left adrenal gland nodular thickening  PLAN: -Discussed lab results from 02/02/2023 with the patient. CBC shows slightly decreased WBC at 3.7 K, slightly decreased hemoglobin at 11.6 g/dL, and slightly decreased hematocrit ar 36.1%.  CMP shows elevated glucose level at 159 mg/dL. Slightly elevated Sedimentation rate at 26. Vitamin-D level at 65.82. LDH in the normal range at 131.  -Discussed PET scan results from 02/02/2023 with the patient. Shows Stable borderline enlarged axillary, subpectoral, prevascular, subcarinal, pelvic and inguinal lymph nodes with mild hypermetabolism. No findings for solid organ disease or osseous involvement. -Discussed that there is low-level inflammation, which could be caused by seasonal allergies. PET scan does not show any changes from last year. -Patient does not show any symptoms and his labs does not show any abnormalities that suggest overt progression suggestive of lymphoma  FOLLOW-UP: F/u with PCP with labs in 6 months RTC with Dr Candise Che with labs and CT chest/abd/pelvis in 12 months  The total time spent in the appointment was 20 minutes* .  All of the patient's questions were answered with apparent satisfaction. The patient knows to call the clinic with any problems, questions or concerns.   Wyvonnia Lora MD MS AAHIVMS [facility] Hematology/Oncology Physician [facility]  .*Total Encounter Time as defined by the Centers for Medicare and Medicaid Services includes, in addition to the face-to-face time of a patient visit (documented in the note above) non-face-to-face time: obtaining and reviewing outside history, ordering and reviewing medications, tests or procedures, care coordination (communications with other health care professionals or caregivers) and documentation in the medical record.   I, Ok Edwards, am acting as a Neurosurgeon for Wyvonnia Lora, MD. .I  have reviewed the above documentation for accuracy and completeness, and I agree with the above. Johney Maine MD

## 2023-02-17 DIAGNOSIS — E7800 Pure hypercholesterolemia, unspecified: Secondary | ICD-10-CM | POA: Diagnosis not present

## 2023-02-17 DIAGNOSIS — E0869 Diabetes mellitus due to underlying condition with other specified complication: Secondary | ICD-10-CM | POA: Diagnosis not present

## 2023-02-17 DIAGNOSIS — G4733 Obstructive sleep apnea (adult) (pediatric): Secondary | ICD-10-CM | POA: Diagnosis not present

## 2023-02-17 DIAGNOSIS — I10 Essential (primary) hypertension: Secondary | ICD-10-CM | POA: Diagnosis not present

## 2023-02-17 DIAGNOSIS — E663 Overweight: Secondary | ICD-10-CM | POA: Diagnosis not present

## 2023-02-18 ENCOUNTER — Other Ambulatory Visit (HOSPITAL_COMMUNITY): Payer: Self-pay

## 2023-02-18 MED ORDER — LOSARTAN POTASSIUM 25 MG PO TABS
25.0000 mg | ORAL_TABLET | Freq: Every day | ORAL | 3 refills | Status: DC
  Filled 2023-08-04: qty 90, 90d supply, fill #0
  Filled 2023-11-04: qty 90, 90d supply, fill #1
  Filled 2024-01-29: qty 90, 90d supply, fill #2

## 2023-02-18 MED ORDER — FREESTYLE LIBRE 3 READER DEVI
1 refills | Status: AC
  Filled 2023-02-18: qty 1, 90d supply, fill #0
  Filled 2023-02-18: qty 1, 30d supply, fill #0

## 2023-02-18 MED ORDER — OZEMPIC (0.25 OR 0.5 MG/DOSE) 2 MG/3ML ~~LOC~~ SOPN
0.5000 mg | PEN_INJECTOR | SUBCUTANEOUS | 5 refills | Status: DC
  Filled 2023-02-18: qty 3, 28d supply, fill #0
  Filled 2023-04-21: qty 3, 28d supply, fill #1
  Filled 2023-05-21: qty 3, 28d supply, fill #2
  Filled 2023-06-14: qty 3, 28d supply, fill #3

## 2023-02-18 MED ORDER — METFORMIN HCL ER 500 MG PO TB24
500.0000 mg | ORAL_TABLET | Freq: Two times a day (BID) | ORAL | 3 refills | Status: DC
  Filled 2023-02-18: qty 180, 90d supply, fill #0

## 2023-02-18 MED ORDER — ROSUVASTATIN CALCIUM 10 MG PO TABS
10.0000 mg | ORAL_TABLET | Freq: Every day | ORAL | 3 refills | Status: DC
  Filled 2023-02-18: qty 90, 90d supply, fill #0
  Filled 2023-11-04: qty 90, 90d supply, fill #1

## 2023-02-18 MED ORDER — FREESTYLE LIBRE 3 SENSOR MISC
3 refills | Status: AC
  Filled 2023-02-18: qty 2, 28d supply, fill #0
  Filled 2023-03-28: qty 2, 28d supply, fill #1
  Filled 2023-04-28: qty 2, 28d supply, fill #2
  Filled 2023-05-27: qty 2, 28d supply, fill #3
  Filled 2023-06-24: qty 2, 28d supply, fill #4
  Filled 2023-07-24: qty 2, 28d supply, fill #5
  Filled 2023-08-24: qty 2, 28d supply, fill #6
  Filled 2023-09-19: qty 2, 28d supply, fill #7
  Filled 2023-10-21: qty 2, 28d supply, fill #8
  Filled 2023-11-16: qty 2, 28d supply, fill #9
  Filled 2023-12-14: qty 2, 28d supply, fill #10
  Filled 2024-01-12: qty 2, 28d supply, fill #11

## 2023-02-19 ENCOUNTER — Other Ambulatory Visit (HOSPITAL_COMMUNITY): Payer: Self-pay

## 2023-02-20 ENCOUNTER — Other Ambulatory Visit: Payer: Self-pay

## 2023-02-20 ENCOUNTER — Other Ambulatory Visit (HOSPITAL_COMMUNITY): Payer: Self-pay

## 2023-02-20 MED ORDER — OZEMPIC (0.25 OR 0.5 MG/DOSE) 2 MG/3ML ~~LOC~~ SOPN: PEN_INJECTOR | SUBCUTANEOUS | 0 refills | Status: DC

## 2023-02-20 MED ORDER — METFORMIN HCL ER 500 MG PO TB24
1000.0000 mg | ORAL_TABLET | Freq: Two times a day (BID) | ORAL | 3 refills | Status: DC
  Filled 2023-04-30 – 2023-05-01 (×2): qty 360, 90d supply, fill #0
  Filled 2023-08-17: qty 360, 90d supply, fill #1

## 2023-03-06 ENCOUNTER — Telehealth: Payer: Self-pay | Admitting: Hematology and Oncology

## 2023-03-28 ENCOUNTER — Other Ambulatory Visit (HOSPITAL_COMMUNITY): Payer: Self-pay

## 2023-04-28 ENCOUNTER — Other Ambulatory Visit: Payer: Self-pay

## 2023-04-30 ENCOUNTER — Other Ambulatory Visit (HOSPITAL_COMMUNITY): Payer: Self-pay

## 2023-05-01 ENCOUNTER — Other Ambulatory Visit (HOSPITAL_COMMUNITY): Payer: Self-pay

## 2023-05-21 ENCOUNTER — Other Ambulatory Visit (HOSPITAL_COMMUNITY): Payer: Self-pay

## 2023-05-22 DIAGNOSIS — I10 Essential (primary) hypertension: Secondary | ICD-10-CM | POA: Diagnosis not present

## 2023-05-22 DIAGNOSIS — E7800 Pure hypercholesterolemia, unspecified: Secondary | ICD-10-CM | POA: Diagnosis not present

## 2023-05-22 DIAGNOSIS — G4733 Obstructive sleep apnea (adult) (pediatric): Secondary | ICD-10-CM | POA: Diagnosis not present

## 2023-05-22 DIAGNOSIS — R591 Generalized enlarged lymph nodes: Secondary | ICD-10-CM | POA: Diagnosis not present

## 2023-05-22 DIAGNOSIS — E663 Overweight: Secondary | ICD-10-CM | POA: Diagnosis not present

## 2023-05-22 DIAGNOSIS — E0869 Diabetes mellitus due to underlying condition with other specified complication: Secondary | ICD-10-CM | POA: Diagnosis not present

## 2023-05-24 IMAGING — CT CT HEART MORP W/ CTA COR W/ SCORE W/ CA W/CM &/OR W/O CM
4 of 7 series · 8 of 20 positions shown, 9 images · non-contrast
Comparison: None.
COMPARISON: None.

Addendum:
EXAM:
OVER-READ INTERPRETATION  CT CHEST

The following report is an over-read performed by radiologist Dr.
Miodragdaca Velickovski [REDACTED] on 06/20/2021. This
over-read does not include interpretation of cardiac or coronary
anatomy or pathology. The coronary calcium score/coronary CTA
interpretation by the cardiologist is attached.
CLINICAL DATA: 50 year old with HTN and chest pain.
Cardiac/Coronary  CTA
TECHNIQUE: The patient was scanned on a [employer] Force scanner.

[Series 4: ts diast sharp · axial · 0.39mm/px · z∈[+1214,+1255]mm · 2 of 303 slices shown]
[im 101/303  lung]
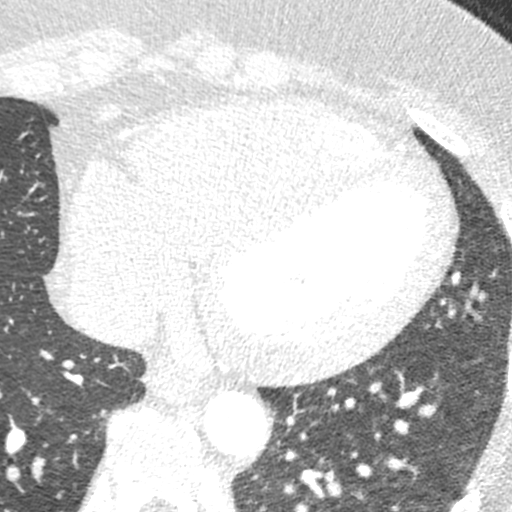
[im 202/303  lung]
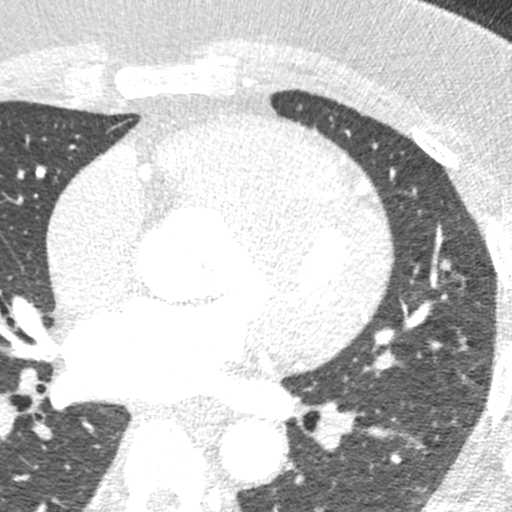

[Series 5: best diast · axial · 0.39mm/px · z∈[+1214,+1255]mm · 2 of 303 slices shown, 3 images]
[im 101/303  vessel]
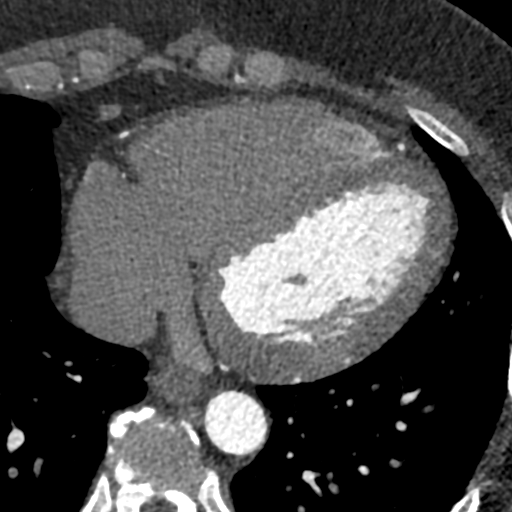
[im 101/303  lung]
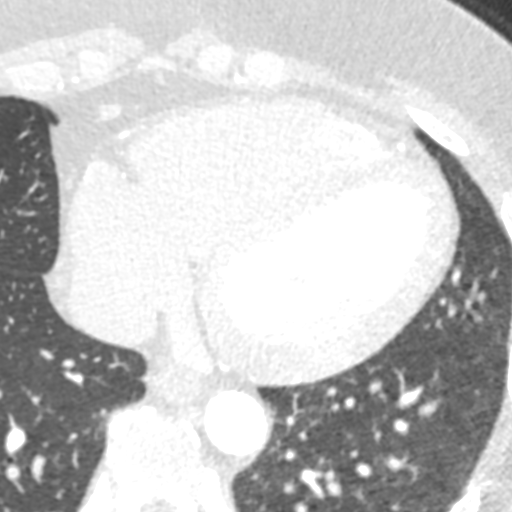
[im 202/303  vessel]
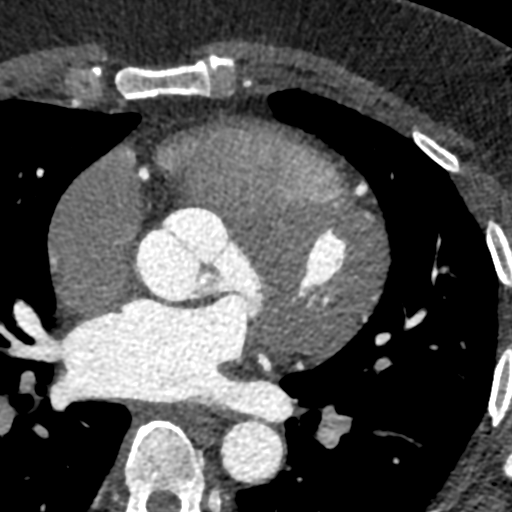

[Series 6: best syst · axial · 0.39mm/px · z∈[+1214,+1255]mm · 2 of 303 slices shown]
[im 101/303  vessel]
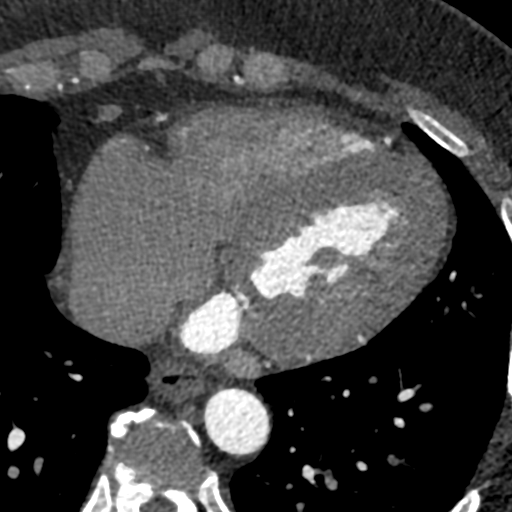
[im 202/303  vessel]
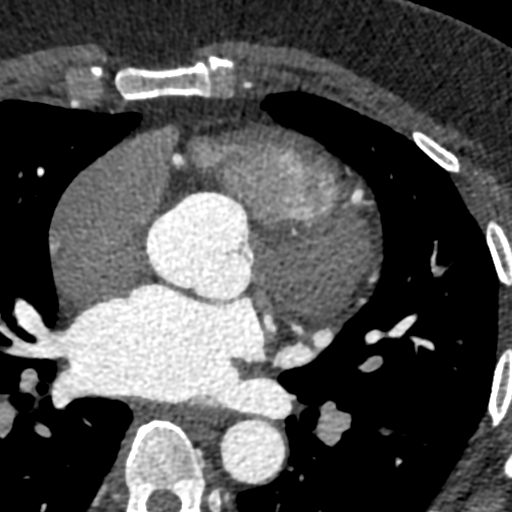

[Series 7: ts syst sharp · axial · 0.39mm/px · z∈[+1214,+1255]mm · 2 of 303 slices shown]
[im 101/303  lung]
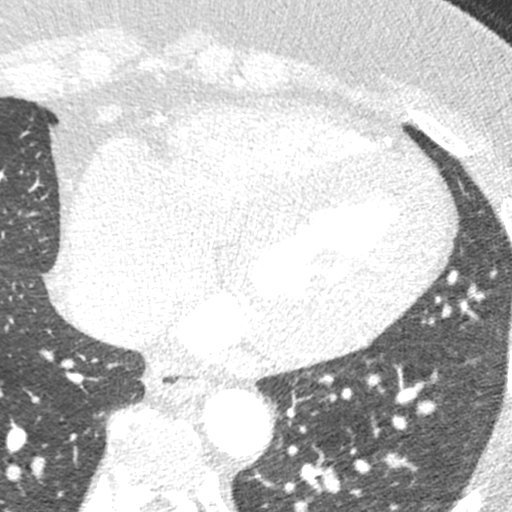
[im 202/303  lung]
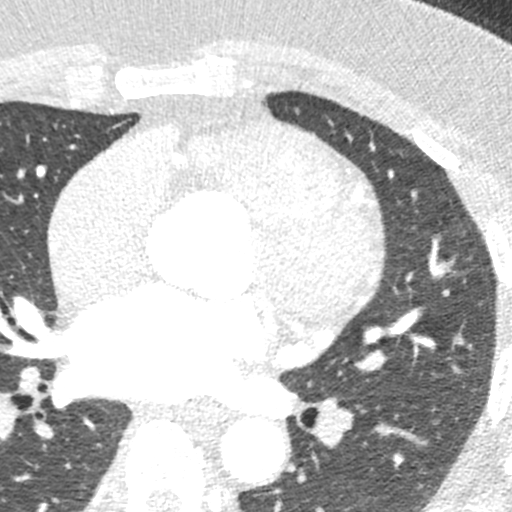

[8 of 20 positions shown; findings below may reference images not displayed]

FINDINGS: In the anterior mediastinum there is some amorphous soft tissue
which is incompletely imaged, but concerning for anterior
mediastinal mass. Within the visualized portions of the thorax there
are no suspicious appearing pulmonary nodules or masses, there is no
acute consolidative airspace disease, no pleural effusions and no
pneumothorax. Visualized portions of the upper abdomen are
unremarkable. There are no aggressive appearing lytic or blastic
lesions noted in the visualized portions of the skeleton.
IMPRESSION: 1. Anterior mediastinal mass incompletely imaged and therefore not
characterized. Differential considerations include both malignant
and benign etiologies. Further evaluation with contrast enhanced
chest CT is recommended in the near future to better evaluate these
findings.
FINDINGS: A 110 kV prospective scan was triggered in the descending thoracic
aorta at 111 HU's. Axial non-contrast 3 mm slices were carried out
through the heart. The data set was analyzed on a dedicated work
station and scored using the Agatson method. Gantry rotation speed
was 250 msecs and collimation was .6 mm. 0.8 mg of sl NTG was given.
The 3D data set was reconstructed in 5% intervals of the 67-82 % of
the R-R cycle. Diastolic phases were analyzed on a dedicated work
station using MPR, MIP and VRT modes. The patient received 80 cc of
contrast.

Aorta: Normal size 37 mm ascending aorta. No calcifications. No
dissection.

Aortic Valve:  Trileaflet.  No calcifications.

Coronary Arteries:  Normal coronary origin.  Right dominance.

RCA is a large dominant artery that gives rise to PDA and PLA. There
is small focal calcified plaque, 0-10% stenosis.

Left main is a large artery that gives rise to LAD and LCX arteries.

LAD is a large vessel that has scattered calcified plaque, 0-10%
stenosis.

LCX is a non-dominant artery that gives rise to one large OM1
branch. There is scattered calcified plaque, 0-10% stenosis.

Other findings:

Normal pulmonary vein drainage into the left atrium.

Normal left atrial appendage without a thrombus.

Normal size of the pulmonary artery.

Please see radiology report for non cardiac findings.
IMPRESSION: 1. Normal coronary origin with right dominance.

2. Mild scattered calcified plaque, 0-10%, non flow limiting.
(Calcium score was not performed)

CAD-RADS 1. Minimal non-obstructive CAD (0-24%). Consider
non-atherosclerotic causes of chest pain. Consider preventive
therapy and risk factor modification.

*** End of Addendum ***
EXAM:
OVER-READ INTERPRETATION  CT CHEST

The following report is an over-read performed by radiologist Dr.
Miodragdaca Velickovski [REDACTED] on 06/20/2021. This
over-read does not include interpretation of cardiac or coronary
anatomy or pathology. The coronary calcium score/coronary CTA
interpretation by the cardiologist is attached.
FINDINGS: In the anterior mediastinum there is some amorphous soft tissue
which is incompletely imaged, but concerning for anterior
mediastinal mass. Within the visualized portions of the thorax there
are no suspicious appearing pulmonary nodules or masses, there is no
acute consolidative airspace disease, no pleural effusions and no
pneumothorax. Visualized portions of the upper abdomen are
unremarkable. There are no aggressive appearing lytic or blastic
lesions noted in the visualized portions of the skeleton.
IMPRESSION: 1. Anterior mediastinal mass incompletely imaged and therefore not
characterized. Differential considerations include both malignant
and benign etiologies. Further evaluation with contrast enhanced
chest CT is recommended in the near future to better evaluate these
findings.

## 2023-05-24 IMAGING — CT CT ABD-PELV W/ CM
3 of 6 series · 13 of 46 positions shown, 19 images · IV contrast (APPLIED)
Comparison: None.

CLINICAL DATA: Hyperaldosteronism

EXAM:
CT ABDOMEN AND PELVIS WITH CONTRAST
TECHNIQUE: Multidetector CT imaging of the abdomen and pelvis was performed
using the standard protocol following bolus administration of
intravenous contrast.
CONTRAST:  95mL OMNIPAQUE IOHEXOL 350 MG/ML SOLN

[Series 6: abdomen 5.0 · axial · 0.81mm/px · z∈[+820,+1180]mm · 9 of 92 slices shown, 15 images]
[im 10/92  soft-tissue]
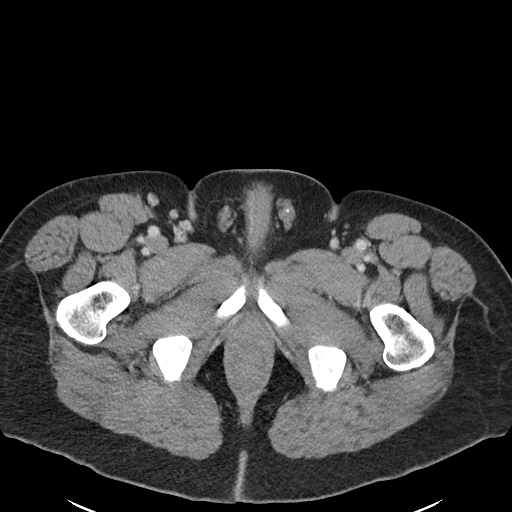
[im 10/92  bone]
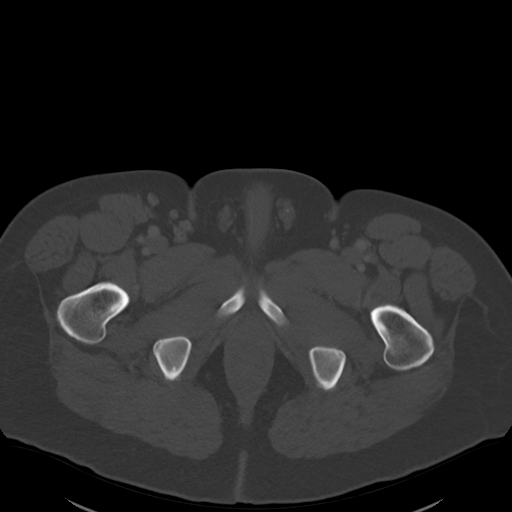
[im 19/92  soft-tissue]
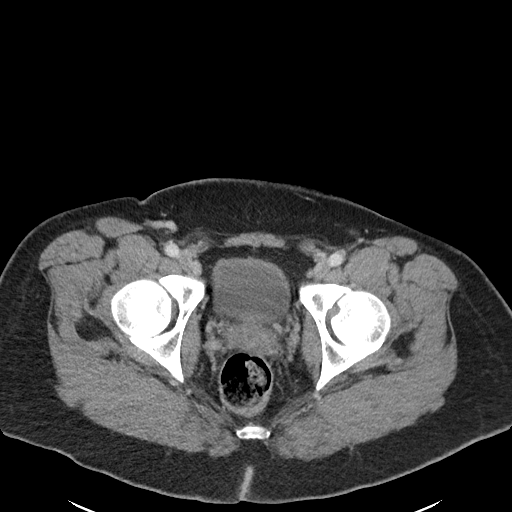
[im 28/92  soft-tissue]
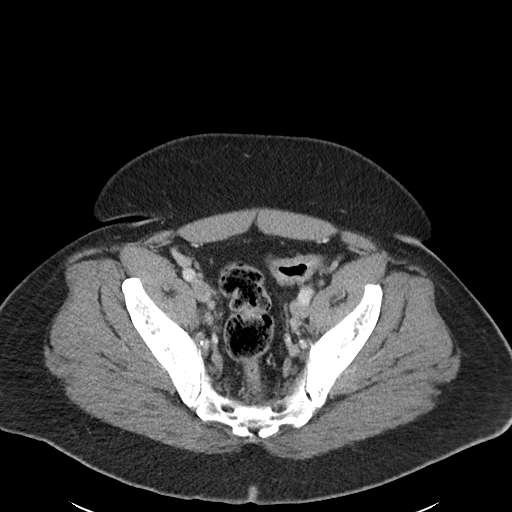
[im 37/92  soft-tissue]
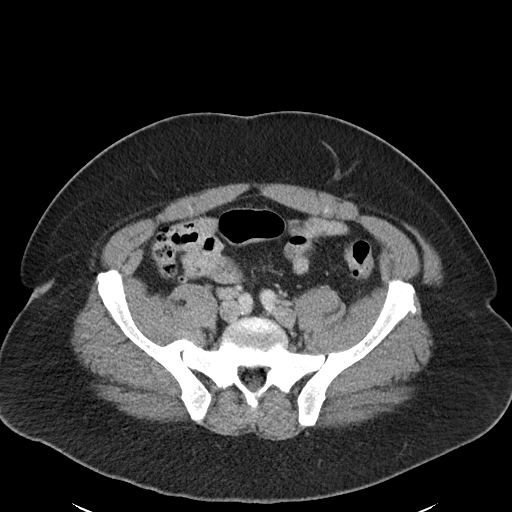
[im 46/92  soft-tissue]
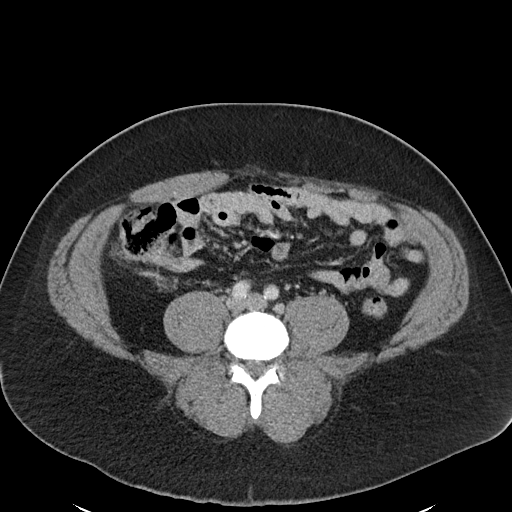
[im 55/92  soft-tissue]
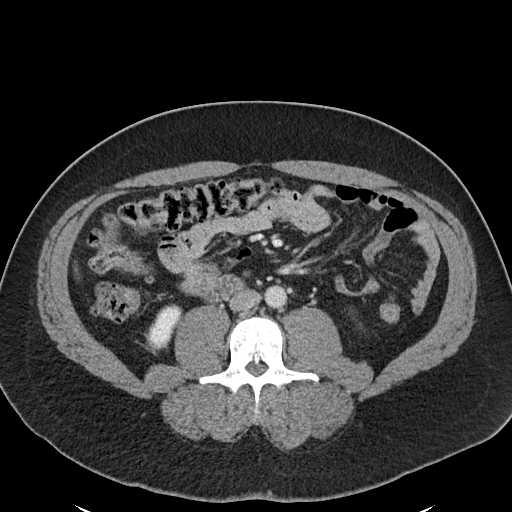
[im 55/92  lung]
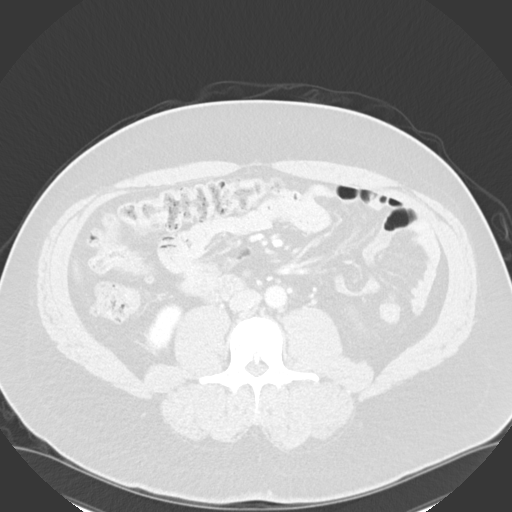
[im 64/92  soft-tissue]
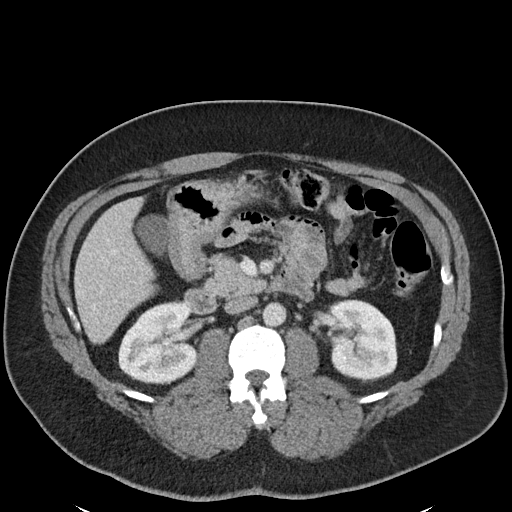
[im 64/92  lung]
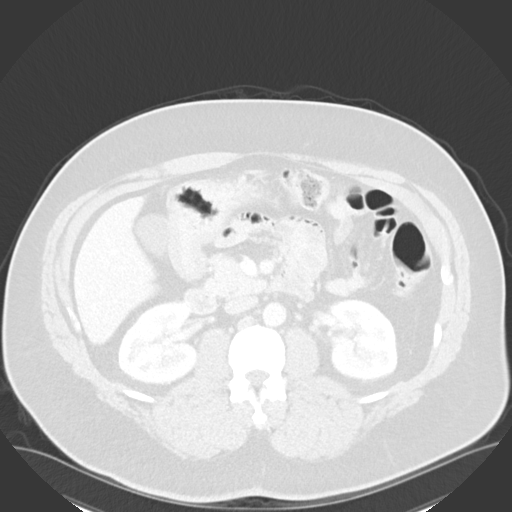
[im 73/92  soft-tissue]
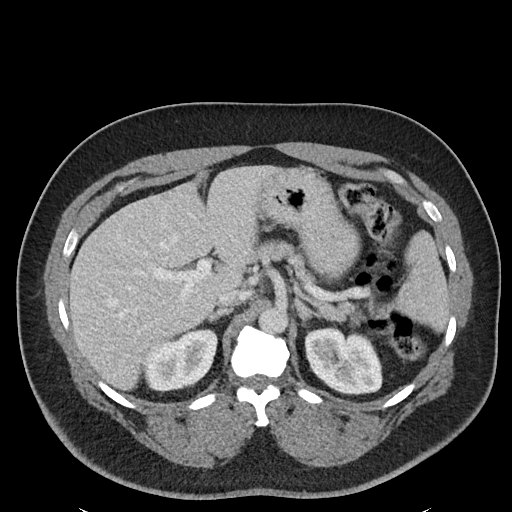
[im 73/92  lung]
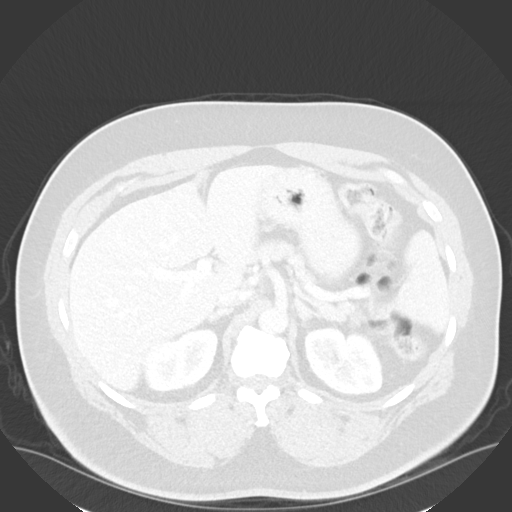
[im 82/92  soft-tissue]
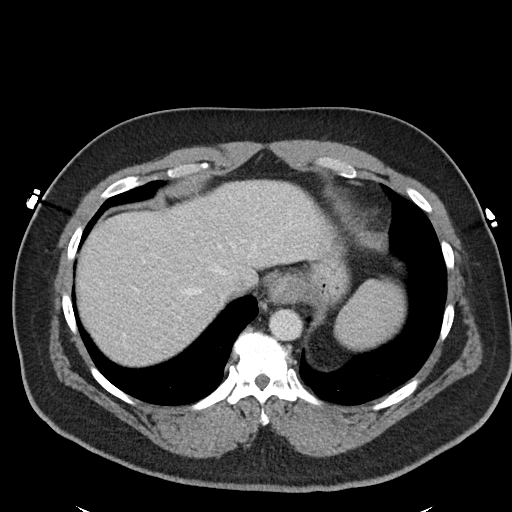
[im 82/92  lung]
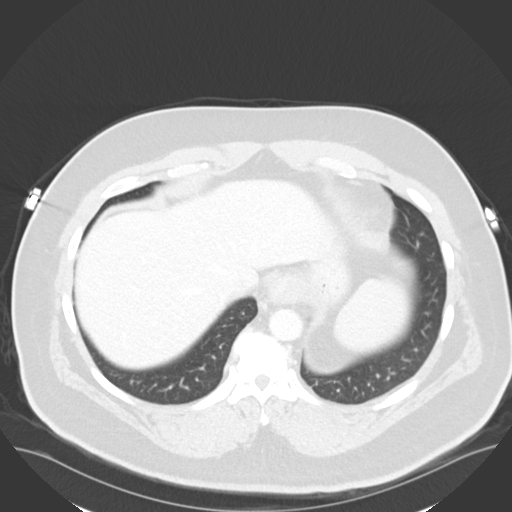
[im 82/92  bone]
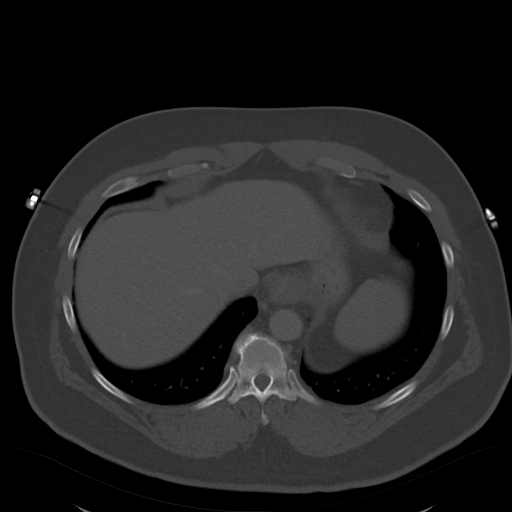

[Series 9: abdomen 3.0 mpr cor · coronal · 0.91mm/px · 3 of 112 slices shown]
[im 38/112  soft-tissue]
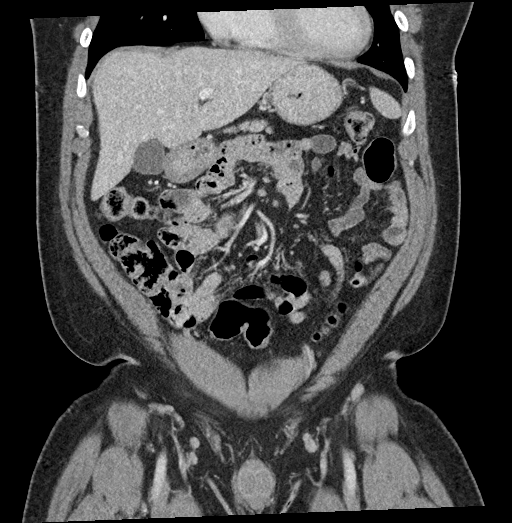
[im 50/112  soft-tissue]
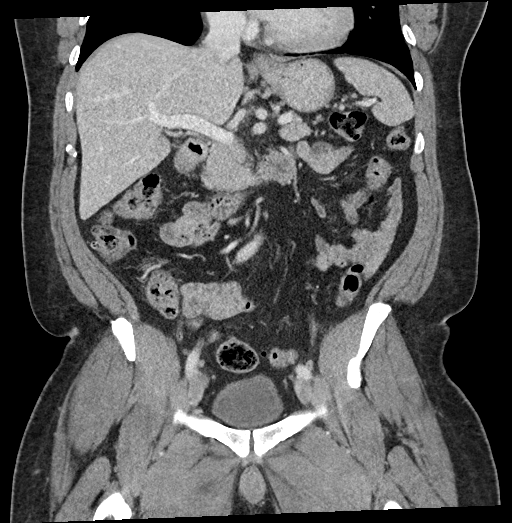
[im 62/112  soft-tissue]
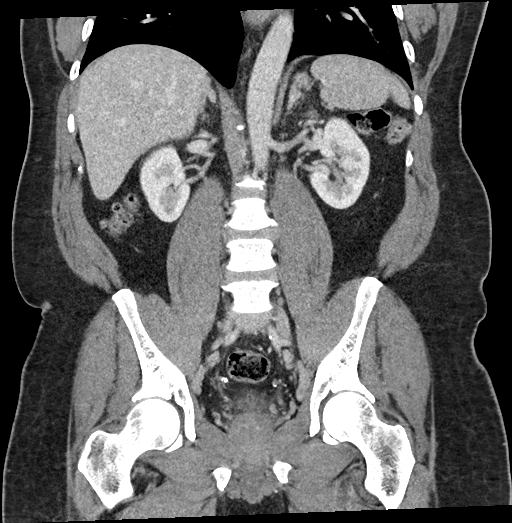

[Series 10: abdomen 3.0 mpr sag · sagittal · 0.76mm/px · 1 of 155 slices shown]
[im 52/155  soft-tissue]
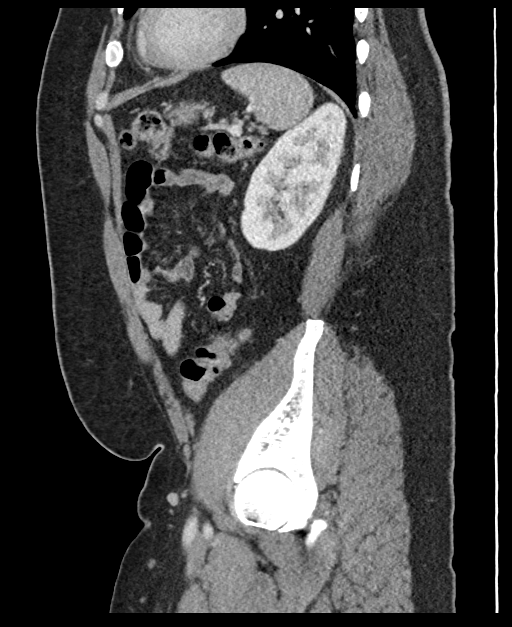

[13 of 46 positions shown; findings below may reference images not displayed]

FINDINGS: Inferior chest: The lung bases are well-aerated.

Hepatobiliary: The liver is normal in size without focal
abnormality. No intrahepatic or extrahepatic biliary ductal
dilation. The gallbladder appears normal.

Spleen: Normal in size without focal abnormality.

Pancreas: No pancreatic ductal dilatation or surrounding
inflammatory changes.

Adrenals/Urinary Tract: The right adrenal gland appears normal.
Nodular thickening of the lateral limb of the left adrenal gland
measuring up to 1.5 x 1.3 cm. Kidneys are normal, without renal
calculi, focal lesion, or hydronephrosis. Bladder is unremarkable.

Stomach/Bowel: The stomach, small bowel and large bowel are normal
in caliber without abnormal wall thickening or surrounding
inflammatory changes. The appendix is normal.

Reproductive: Prostate is unremarkable.

Lymphatic: No enlarged lymph nodes in the abdomen or pelvis.

Vasculature: The abdominal aorta is normal in caliber. The portal
venous system is patent.

Other: No abdominopelvic ascites.

Musculoskeletal: No aggressive osseous lesions. The soft tissues are
unremarkable.
IMPRESSION: There is nodular thickening of the lateral limb of the left adrenal
gland measuring up to 1.5 cm, which may represent an adrenal
cortical adenoma. The right adrenal gland appears normal.

## 2023-05-26 DIAGNOSIS — I10 Essential (primary) hypertension: Secondary | ICD-10-CM | POA: Diagnosis not present

## 2023-05-26 DIAGNOSIS — E1169 Type 2 diabetes mellitus with other specified complication: Secondary | ICD-10-CM | POA: Diagnosis not present

## 2023-05-26 DIAGNOSIS — E7800 Pure hypercholesterolemia, unspecified: Secondary | ICD-10-CM | POA: Diagnosis not present

## 2023-05-26 DIAGNOSIS — R591 Generalized enlarged lymph nodes: Secondary | ICD-10-CM | POA: Diagnosis not present

## 2023-05-26 DIAGNOSIS — D649 Anemia, unspecified: Secondary | ICD-10-CM | POA: Diagnosis not present

## 2023-05-27 ENCOUNTER — Other Ambulatory Visit (HOSPITAL_COMMUNITY): Payer: Self-pay

## 2023-05-29 ENCOUNTER — Encounter: Payer: Self-pay | Admitting: Internal Medicine

## 2023-05-30 ENCOUNTER — Other Ambulatory Visit (HOSPITAL_COMMUNITY): Payer: Self-pay

## 2023-06-15 ENCOUNTER — Other Ambulatory Visit (HOSPITAL_COMMUNITY): Payer: Self-pay

## 2023-06-23 ENCOUNTER — Other Ambulatory Visit (HOSPITAL_COMMUNITY): Payer: Self-pay

## 2023-06-23 MED ORDER — OZEMPIC (1 MG/DOSE) 4 MG/3ML ~~LOC~~ SOPN
1.0000 mg | PEN_INJECTOR | SUBCUTANEOUS | 1 refills | Status: DC
  Filled 2023-06-23 – 2023-07-07 (×3): qty 3, 28d supply, fill #0
  Filled 2023-08-04: qty 3, 28d supply, fill #1

## 2023-06-24 ENCOUNTER — Other Ambulatory Visit: Payer: Self-pay

## 2023-06-25 ENCOUNTER — Other Ambulatory Visit (HOSPITAL_COMMUNITY): Payer: Self-pay

## 2023-06-27 ENCOUNTER — Other Ambulatory Visit (HOSPITAL_COMMUNITY): Payer: Self-pay

## 2023-07-07 ENCOUNTER — Other Ambulatory Visit (HOSPITAL_COMMUNITY): Payer: Self-pay

## 2023-07-07 ENCOUNTER — Other Ambulatory Visit: Payer: Self-pay

## 2023-07-24 ENCOUNTER — Other Ambulatory Visit: Payer: Self-pay

## 2023-07-25 ENCOUNTER — Other Ambulatory Visit (HOSPITAL_COMMUNITY): Payer: Self-pay

## 2023-07-27 ENCOUNTER — Other Ambulatory Visit: Payer: Self-pay

## 2023-07-28 ENCOUNTER — Other Ambulatory Visit (HOSPITAL_COMMUNITY): Payer: Self-pay

## 2023-08-04 ENCOUNTER — Other Ambulatory Visit: Payer: Self-pay

## 2023-08-04 ENCOUNTER — Other Ambulatory Visit (HOSPITAL_COMMUNITY): Payer: Self-pay

## 2023-08-24 ENCOUNTER — Other Ambulatory Visit (HOSPITAL_COMMUNITY): Payer: Self-pay

## 2023-08-26 ENCOUNTER — Other Ambulatory Visit (HOSPITAL_COMMUNITY): Payer: Self-pay

## 2023-09-01 ENCOUNTER — Other Ambulatory Visit (HOSPITAL_COMMUNITY): Payer: Self-pay

## 2023-09-01 MED ORDER — OZEMPIC (1 MG/DOSE) 4 MG/3ML ~~LOC~~ SOPN
1.0000 mg | PEN_INJECTOR | SUBCUTANEOUS | 1 refills | Status: DC
  Filled 2023-09-01: qty 3, 28d supply, fill #0
  Filled 2023-09-19 – 2023-09-28 (×2): qty 3, 28d supply, fill #1

## 2023-09-06 ENCOUNTER — Encounter (HOSPITAL_BASED_OUTPATIENT_CLINIC_OR_DEPARTMENT_OTHER): Payer: Self-pay | Admitting: Pulmonary Disease

## 2023-09-07 NOTE — Telephone Encounter (Signed)
Done. Patient notified via mychart.

## 2023-09-10 ENCOUNTER — Encounter (HOSPITAL_BASED_OUTPATIENT_CLINIC_OR_DEPARTMENT_OTHER): Payer: Self-pay | Admitting: Pulmonary Disease

## 2023-09-10 ENCOUNTER — Other Ambulatory Visit (HOSPITAL_COMMUNITY): Payer: Self-pay

## 2023-09-10 ENCOUNTER — Telehealth (HOSPITAL_BASED_OUTPATIENT_CLINIC_OR_DEPARTMENT_OTHER): Payer: Commercial Managed Care - PPO | Admitting: Pulmonary Disease

## 2023-09-10 DIAGNOSIS — G4733 Obstructive sleep apnea (adult) (pediatric): Secondary | ICD-10-CM

## 2023-09-10 MED ORDER — ALBUTEROL SULFATE HFA 108 (90 BASE) MCG/ACT IN AERS
2.0000 | INHALATION_SPRAY | Freq: Four times a day (QID) | RESPIRATORY_TRACT | 0 refills | Status: AC | PRN
  Filled 2023-09-10: qty 6.7, 25d supply, fill #0

## 2023-09-10 NOTE — Progress Notes (Signed)
Subjective:    Patient ID: Mark Nichols, male    DOB: May 26, 1971, 52 y.o.   MRN: [id number]  HPI  I connected with  Donte Glickstein on 09/10/23 by  video enabled telemedicine application and verified that I am speaking with the correct person using two identifiers.     Location: Patient: Hotel Provider: Office - [facility] - [employer] [place]   I discussed the limitations of evaluation and management by telemedicine and the availability of in person appointments. The patient expressed understanding and agreed to proceed. I also discussed with the patient that there may be a patient responsible charge related to this service. The patient expressed understanding and agreed to proceed.   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 Last seen by me in 2019 We diagnosed severe OSA and he was placed on CPAP therapy.  After initial adjustment issues he settled down well with this and noticed improvement in his daytime somnolence and fatigue.  He has a nasal mask and reports good compliance with this.  PMH : Hypertension, difficult to control Axillary and mediastinal lymphadenopathy, Lymph node biopsy nondiagnostic, mild hypermetabolism on PET 01/2023 Diabetes type 2  He lost significant weight with Ozempic from 285 pounds on his last visit to his current weight of 231 pounds. He reports feeling rested in the daytime. He has a late bedtime of 1 AM, sleep latency minimal, he wakes up around 5:30 AM on workdays on weekends he may stay in bed until 7:30 AM.  No snoring has been noted by his wife.  He denies daytime naps. There is no history suggestive of cataplexy, sleep paralysis or parasomnias    Significant tests/ events reviewed   HST 11/2017  Severe, AHI 77/h, regardless of body position. He slept on his left side for about an hour and this also  showed severe OSA   Review of Systems neg for any significant sore throat, dysphagia, itching, sneezing, nasal congestion or excess/ purulent secretions, fever, chills, sweats, unintended wt loss, pleuritic or exertional cp, hempoptysis, orthopnea pnd or change in chronic leg swelling. Also denies presyncope, palpitations, heartburn, abdominal pain, nausea, vomiting, diarrhea or change in bowel or urinary habits, dysuria,hematuria, rash, arthralgias, visual complaints, headache, numbness weakness or ataxia.     Objective:   Physical Exam  No accessory muscle use, able to speak in full sentences      Assessment & Plan:   CPAP download was reviewed which shows excellent control of events on auto settings 5 to 15 cm with average pressure 11 cm.  He has been afebrile leak.  He is very compliant without a single missed night average about 5 hours per night.  CPAP certainly helped improve his daytime somnolence and fatigue.  OSA -CPAP supplies will be renewed for a year Weight loss encouraged, compliance with goal of at least 4-6 hrs every night is the expectation. Advised against medications with sedative side effects Cautioned against driving when sleepy - understanding that sleepiness will vary on a day to day basis  Obesity -he has lost more than 50 pounds and this is only helped sleep disordered breathing.  His average CPAP pressure is reduced.  However I do not feel he is ready to come off CPAP yet.  He will continue on CPAP therapy.  Weight loss is highly helped his blood pressure control and his diabetes  Total encounter time was 30 minutes  Alontae Chaloux V. Vassie Loll MD

## 2023-09-17 LAB — MOLECULAR PATHOLOGY

## 2023-09-19 ENCOUNTER — Other Ambulatory Visit (HOSPITAL_COMMUNITY): Payer: Self-pay

## 2023-09-21 ENCOUNTER — Other Ambulatory Visit: Payer: Self-pay

## 2023-09-21 ENCOUNTER — Other Ambulatory Visit (HOSPITAL_COMMUNITY): Payer: Self-pay

## 2023-09-29 ENCOUNTER — Other Ambulatory Visit (HOSPITAL_COMMUNITY): Payer: Self-pay

## 2023-10-07 DIAGNOSIS — G4733 Obstructive sleep apnea (adult) (pediatric): Secondary | ICD-10-CM | POA: Diagnosis not present

## 2023-10-09 DIAGNOSIS — Z833 Family history of diabetes mellitus: Secondary | ICD-10-CM | POA: Diagnosis not present

## 2023-10-09 DIAGNOSIS — D509 Iron deficiency anemia, unspecified: Secondary | ICD-10-CM | POA: Diagnosis not present

## 2023-10-09 DIAGNOSIS — R591 Generalized enlarged lymph nodes: Secondary | ICD-10-CM | POA: Diagnosis not present

## 2023-10-09 DIAGNOSIS — E7800 Pure hypercholesterolemia, unspecified: Secondary | ICD-10-CM | POA: Diagnosis not present

## 2023-10-09 DIAGNOSIS — I10 Essential (primary) hypertension: Secondary | ICD-10-CM | POA: Diagnosis not present

## 2023-10-09 DIAGNOSIS — E1165 Type 2 diabetes mellitus with hyperglycemia: Secondary | ICD-10-CM | POA: Diagnosis not present

## 2023-10-09 DIAGNOSIS — R634 Abnormal weight loss: Secondary | ICD-10-CM | POA: Diagnosis not present

## 2023-10-09 DIAGNOSIS — E663 Overweight: Secondary | ICD-10-CM | POA: Diagnosis not present

## 2023-10-09 DIAGNOSIS — G4733 Obstructive sleep apnea (adult) (pediatric): Secondary | ICD-10-CM | POA: Diagnosis not present

## 2023-10-09 DIAGNOSIS — E279 Disorder of adrenal gland, unspecified: Secondary | ICD-10-CM | POA: Diagnosis not present

## 2023-10-09 DIAGNOSIS — E1169 Type 2 diabetes mellitus with other specified complication: Secondary | ICD-10-CM | POA: Diagnosis not present

## 2023-10-09 DIAGNOSIS — D649 Anemia, unspecified: Secondary | ICD-10-CM | POA: Diagnosis not present

## 2023-10-09 DIAGNOSIS — Z832 Family history of diseases of the blood and blood-forming organs and certain disorders involving the immune mechanism: Secondary | ICD-10-CM | POA: Diagnosis not present

## 2023-10-13 DIAGNOSIS — D509 Iron deficiency anemia, unspecified: Secondary | ICD-10-CM | POA: Diagnosis not present

## 2023-10-13 DIAGNOSIS — E279 Disorder of adrenal gland, unspecified: Secondary | ICD-10-CM | POA: Diagnosis not present

## 2023-10-21 ENCOUNTER — Other Ambulatory Visit (HOSPITAL_COMMUNITY): Payer: Self-pay

## 2023-10-24 ENCOUNTER — Other Ambulatory Visit (HOSPITAL_COMMUNITY): Payer: Self-pay

## 2023-10-27 DIAGNOSIS — D485 Neoplasm of uncertain behavior of skin: Secondary | ICD-10-CM | POA: Diagnosis not present

## 2023-10-27 DIAGNOSIS — L821 Other seborrheic keratosis: Secondary | ICD-10-CM | POA: Diagnosis not present

## 2023-10-27 DIAGNOSIS — B079 Viral wart, unspecified: Secondary | ICD-10-CM | POA: Diagnosis not present

## 2023-11-04 ENCOUNTER — Other Ambulatory Visit: Payer: Self-pay | Admitting: Internal Medicine

## 2023-11-04 ENCOUNTER — Other Ambulatory Visit (HOSPITAL_COMMUNITY): Payer: Self-pay

## 2023-11-04 MED ORDER — OZEMPIC (1 MG/DOSE) 4 MG/3ML ~~LOC~~ SOPN
1.0000 mg | PEN_INJECTOR | SUBCUTANEOUS | 1 refills | Status: DC
  Filled 2023-11-04: qty 3, 28d supply, fill #0

## 2023-11-04 MED ORDER — METFORMIN HCL ER 500 MG PO TB24
ORAL_TABLET | ORAL | 3 refills | Status: AC
  Filled 2023-11-04: qty 360, 90d supply, fill #0
  Filled 2024-03-07: qty 360, 90d supply, fill #1
  Filled 2024-06-08: qty 360, 90d supply, fill #2

## 2023-11-05 ENCOUNTER — Other Ambulatory Visit (HOSPITAL_COMMUNITY): Payer: Self-pay

## 2023-11-05 MED ORDER — DAPAGLIFLOZIN PROPANEDIOL 10 MG PO TABS
10.0000 mg | ORAL_TABLET | Freq: Every day | ORAL | 6 refills | Status: DC
  Filled 2023-11-05: qty 30, 30d supply, fill #0

## 2023-11-05 NOTE — Addendum Note (Signed)
 Addended by: Marquis Sitter on: 11/05/2023 01:22 PM   Modules accepted: Orders

## 2023-11-12 ENCOUNTER — Other Ambulatory Visit: Payer: Self-pay

## 2023-11-12 ENCOUNTER — Other Ambulatory Visit (HOSPITAL_COMMUNITY): Payer: Self-pay

## 2023-11-12 MED ORDER — DAPAGLIFLOZIN PROPANEDIOL 10 MG PO TABS
10.0000 mg | ORAL_TABLET | Freq: Every day | ORAL | 3 refills | Status: AC
  Filled 2023-11-12: qty 90, 90d supply, fill #0
  Filled 2024-03-07: qty 90, 90d supply, fill #1
  Filled 2024-06-08: qty 90, 90d supply, fill #2
  Filled 2024-09-07 – 2024-09-08 (×2): qty 90, 90d supply, fill #3

## 2023-11-16 ENCOUNTER — Other Ambulatory Visit: Payer: Self-pay

## 2023-11-16 ENCOUNTER — Other Ambulatory Visit (HOSPITAL_COMMUNITY): Payer: Self-pay

## 2023-11-20 ENCOUNTER — Other Ambulatory Visit (HOSPITAL_COMMUNITY): Payer: Self-pay

## 2023-11-30 ENCOUNTER — Encounter (HOSPITAL_COMMUNITY): Payer: Self-pay

## 2023-11-30 ENCOUNTER — Ambulatory Visit (HOSPITAL_COMMUNITY)
Admission: RE | Admit: 2023-11-30 | Discharge: 2023-11-30 | Disposition: A | Discharge status: Home / Self Care | Source: Ambulatory Visit | Attending: Internal Medicine | Admitting: Internal Medicine

## 2023-11-30 VITALS — BP 130/80 | HR 94 | Wt 209.2 lb

## 2023-11-30 DIAGNOSIS — D61818 Other pancytopenia: Secondary | ICD-10-CM | POA: Diagnosis not present

## 2023-11-30 DIAGNOSIS — Z7984 Long term (current) use of oral hypoglycemic drugs: Secondary | ICD-10-CM | POA: Diagnosis not present

## 2023-11-30 DIAGNOSIS — R599 Enlarged lymph nodes, unspecified: Secondary | ICD-10-CM | POA: Insufficient documentation

## 2023-11-30 DIAGNOSIS — Z7985 Long-term (current) use of injectable non-insulin antidiabetic drugs: Secondary | ICD-10-CM | POA: Insufficient documentation

## 2023-11-30 DIAGNOSIS — R1013 Epigastric pain: Secondary | ICD-10-CM | POA: Diagnosis present

## 2023-11-30 DIAGNOSIS — R9431 Abnormal electrocardiogram [ECG] [EKG]: Secondary | ICD-10-CM | POA: Insufficient documentation

## 2023-11-30 DIAGNOSIS — E1169 Type 2 diabetes mellitus with other specified complication: Secondary | ICD-10-CM | POA: Insufficient documentation

## 2023-11-30 DIAGNOSIS — E669 Obesity, unspecified: Secondary | ICD-10-CM | POA: Insufficient documentation

## 2023-11-30 DIAGNOSIS — G4733 Obstructive sleep apnea (adult) (pediatric): Secondary | ICD-10-CM | POA: Diagnosis not present

## 2023-11-30 DIAGNOSIS — E785 Hyperlipidemia, unspecified: Secondary | ICD-10-CM | POA: Diagnosis not present

## 2023-11-30 DIAGNOSIS — E876 Hypokalemia: Secondary | ICD-10-CM | POA: Diagnosis not present

## 2023-11-30 DIAGNOSIS — I10 Essential (primary) hypertension: Secondary | ICD-10-CM | POA: Diagnosis not present

## 2023-11-30 DIAGNOSIS — R079 Chest pain, unspecified: Secondary | ICD-10-CM

## 2023-11-30 DIAGNOSIS — R0789 Other chest pain: Secondary | ICD-10-CM | POA: Diagnosis not present

## 2023-11-30 LAB — COMPREHENSIVE METABOLIC PANEL WITH GFR
ALT: 11 U/L (ref 0–44)
AST: 17 U/L (ref 15–41)
Albumin: 3.3 g/dL — ABNORMAL LOW (ref 3.5–5.0)
Alkaline Phosphatase: 82 U/L (ref 38–126)
Anion gap: 7 (ref 5–15)
BUN: 15 mg/dL (ref 6–20)
CO2: 26 mmol/L (ref 22–32)
Calcium: 9.3 mg/dL (ref 8.9–10.3)
Chloride: 99 mmol/L (ref 98–111)
Creatinine, Ser: 1.01 mg/dL (ref 0.61–1.24)
GFR, Estimated: >60 mL/min
Glucose, Bld: 133 mg/dL — ABNORMAL HIGH (ref 70–99)
Potassium: 4.2 mmol/L (ref 3.5–5.1)
Sodium: 132 mmol/L — ABNORMAL LOW (ref 135–145)
Total Bilirubin: 0.3 mg/dL (ref 0.0–1.2)
Total Protein: 9.1 g/dL — ABNORMAL HIGH (ref 6.5–8.1)

## 2023-11-30 LAB — CBC
HCT: 33.5 % — ABNORMAL LOW (ref 39.0–52.0)
Hemoglobin: 10.6 g/dL — ABNORMAL LOW (ref 13.0–17.0)
MCH: 23.6 pg — ABNORMAL LOW (ref 26.0–34.0)
MCHC: 31.6 g/dL (ref 30.0–36.0)
MCV: 74.4 fL — ABNORMAL LOW (ref 80.0–100.0)
Platelets: 214 K/uL (ref 150–400)
RBC: 4.5 MIL/uL (ref 4.22–5.81)
RDW: 18.7 % — ABNORMAL HIGH (ref 11.5–15.5)
WBC: 3.4 K/uL — ABNORMAL LOW (ref 4.0–10.5)
nRBC: 0 % (ref 0.0–0.2)

## 2023-11-30 LAB — SEDIMENTATION RATE: Sed Rate: 69 mm/h — ABNORMAL HIGH (ref 0–16)

## 2023-11-30 LAB — TROPONIN I (HIGH SENSITIVITY): Troponin I (High Sensitivity): 3 ng/L (ref <18)

## 2023-11-30 LAB — MAGNESIUM: Magnesium: 1.8 mg/dL (ref 1.7–2.4)

## 2023-11-30 LAB — D-DIMER, QUANTITATIVE: D-Dimer, Quant: 3.74 ug{FEU}/mL — ABNORMAL HIGH (ref 0.00–0.50)

## 2023-11-30 LAB — LIPASE, BLOOD: Lipase: 38 U/L (ref 11–51)

## 2023-11-30 NOTE — Progress Notes (Signed)
 CARDIOLOGY CLINIC NOTE  Patient ID: Mark Nichols, male   DOB: 07/20/1971, 53 y.o.   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 Underwent cardiac CT which showed normal coronaries in 9/12.  Could not tolerate atorva due to myalgias. Recently struggling with hypokalemia/hypomagnesemia/hypocalcemia felt to be due to PPI.  Followed by hematology for lymphadenopathy and pancytopenia.  Cor CT 9/22 LAD 10% mid OM-1 10% RCA 10% distal  EGD 5/24 normal x for hiatal hernia  He presents today for unscheduled visit due to chest pain. Over past year has lost over 100 pounds with diet and exercise. Says that several days ago began having atypical chest and epigastric pain. Comes and goes. Seems to get worse with eating, deep breathing and bending over. Has been taking TUMS with just mild relief. Denies ab pain, melena, BRBPR. No diaphoresis. Able to continue working through it but clearly uncomfortable.     Lab Results  Component Value Date   CHOL 140 10/23/2022   HDL 55 10/23/2022   LDLCALC 76 10/23/2022   LDLDIRECT 183.0 06/04/2011   TRIG 44 10/23/2022   CHOLHDL 2.5 10/23/2022    Review of systems complete and found to be negative unless listed in HPI.   Past Medical History:  Diagnosis Date   Diabetes (HCC)    GERD (gastroesophageal reflux disease)    Hypercholesteremia    Hypertension    Pneumonia    Sleep apnea     Current Outpatient Medications  Medication Sig Dispense Refill   albuterol (VENTOLIN HFA) 108 (90 Base) MCG/ACT inhaler Inhale 2 puffs into the lungs every 6 (six) hours as needed for shortness of breath and wheezing 6.7 g 0   aspirin 81 MG chewable tablet Chew by mouth daily.     Continuous Glucose Receiver (FREESTYLE LIBRE 3 READER) DEVI Use as directed 1 each 1   Continuous Glucose Sensor (FREESTYLE LIBRE 3 SENSOR) MISC use as directed change sensor  every 14 days 24 each 3   dapagliflozin propanediol (FARXIGA) 10 MG TABS tablet Take 1 tablet (10 mg total) by mouth daily. 90 tablet 3   ergocalciferol (VITAMIN D2) 1.25 MG (50000 UT) capsule Take 1 capsule (50,000 Units total) by mouth once a week. 12 capsule 1   famotidine (PEPCID) 20 MG tablet Take 20 mg by mouth 2 (two) times daily.     loratadine (CLARITIN) 10 MG tablet Take 10 mg by mouth daily.     losartan (COZAAR) 25 MG tablet Take 1 tablet (25 mg total) by mouth daily. 90 tablet 3   metFORMIN (GLUCOPHAGE-XR) 500 MG 24 hr tablet Take 2 tablets (1,000 mg total) by mouth with food 2 (two) times daily for 90 days. 360 tablet 3   rosuvastatin (CRESTOR) 10 MG tablet Take 1 tablet (10 mg total) by mouth daily. 90 tablet 3   Semaglutide, 1 MG/DOSE, (OZEMPIC, 1 MG/DOSE,) 4 MG/3ML SOPN Inject 1 mg into the skin once a week. 3 mL 1   No current facility-administered medications for this encounter.     PHYSICAL EXAM: Vitals:   11/30/23 1635  BP: 130/80  Pulse: 94  SpO2: 100%   Wt Readings from Last 3 Encounters:  11/30/23 94.9 kg (209 lb 3.2 oz)  12/12/22 114.8 kg (253 lb)  10/23/22 123.9 kg (273 lb 4 oz)   General:  Fatigued appearing. No resp difficulty HEENT: normal Neck: supple.  no JVD. Carotids 2+ bilat; no bruits. No lymphadenopathy or thryomegaly appreciated. Cor: PMI nondisplaced. Regular rate & rhythm. No rubs, gallops or murmurs. Lungs: clear Abdomen: soft, nontender, nondistended. No hepatosplenomegaly. No bruits or masses. Good bowel sounds. Extremities: no cyanosis, clubbing, rash, edema Neuro: alert & orientedx3, cranial nerves grossly intact. moves all 4 extremities w/o difficulty. Affect pleasant   ECG: Sinus rhythm 89 mild atypical ST elevation v2 Personally reviewed  POCUS echo in Clinic today EF 6-065% no RWMA. RV ok . Personally reviewed  ASSESSMENT & PLAN:  1. Chest pain  - Atypical,  - ECG mildly abnormal but I did POCUS echo in clinic and it is  totally normal - Suspect this Is not cardiac in nature - Check labs including hstrop, d-dmier, LFTs, lipase, ESR  2. Hyperlipidemia - continue crestor  3. OSA - Continue CPAP - Follows with Dr Vassie Loll  4. Pancytopenia/lymphadenopathy - followed by Heme   Addendum: hs trop 3 (normal)  LFTs/lipase ok. Ddimer 3.74 ESR 69. Will get CT chest to further evaluate   Arvilla Meres, MD  11:27 PM

## 2023-12-01 ENCOUNTER — Ambulatory Visit (HOSPITAL_COMMUNITY)
Admission: RE | Admit: 2023-12-01 | Discharge: 2023-12-01 | Disposition: A | Discharge status: Home / Self Care | Source: Ambulatory Visit | Attending: Internal Medicine | Admitting: Internal Medicine

## 2023-12-01 ENCOUNTER — Telehealth (HOSPITAL_COMMUNITY): Payer: Self-pay | Admitting: *Deleted

## 2023-12-01 DIAGNOSIS — R7989 Other specified abnormal findings of blood chemistry: Secondary | ICD-10-CM

## 2023-12-01 DIAGNOSIS — I2510 Atherosclerotic heart disease of native coronary artery without angina pectoris: Secondary | ICD-10-CM | POA: Diagnosis not present

## 2023-12-01 DIAGNOSIS — R591 Generalized enlarged lymph nodes: Secondary | ICD-10-CM | POA: Diagnosis not present

## 2023-12-01 DIAGNOSIS — R079 Chest pain, unspecified: Secondary | ICD-10-CM

## 2023-12-01 MED ORDER — IOHEXOL 350 MG/ML SOLN
75.0000 mL | Freq: Once | INTRAVENOUS | Status: AC | PRN
  Administered 2023-12-01: 75 mL via INTRAVENOUS

## 2023-12-01 NOTE — Telephone Encounter (Signed)
 Per Dr Gala Romney with CP and elevated d-dimer pt needs CT of chest with contrast, order placed will arrange

## 2023-12-07 DIAGNOSIS — H4710 Unspecified papilledema: Secondary | ICD-10-CM | POA: Diagnosis not present

## 2023-12-07 DIAGNOSIS — H11829 Conjunctivochalasis, unspecified eye: Secondary | ICD-10-CM | POA: Diagnosis not present

## 2023-12-07 DIAGNOSIS — E113291 Type 2 diabetes mellitus with mild nonproliferative diabetic retinopathy without macular edema, right eye: Secondary | ICD-10-CM | POA: Diagnosis not present

## 2023-12-07 DIAGNOSIS — B0052 Herpesviral keratitis: Secondary | ICD-10-CM | POA: Diagnosis not present

## 2023-12-07 DIAGNOSIS — H2513 Age-related nuclear cataract, bilateral: Secondary | ICD-10-CM | POA: Diagnosis not present

## 2023-12-11 ENCOUNTER — Other Ambulatory Visit (HOSPITAL_COMMUNITY): Payer: Self-pay

## 2023-12-11 MED ORDER — COLCHICINE 0.6 MG PO TABS
0.6000 mg | ORAL_TABLET | Freq: Two times a day (BID) | ORAL | 1 refills | Status: AC
  Filled 2023-12-11: qty 42, 21d supply, fill #0
  Filled 2024-08-19: qty 42, 21d supply, fill #1

## 2023-12-12 ENCOUNTER — Other Ambulatory Visit (HOSPITAL_COMMUNITY): Payer: Self-pay

## 2023-12-14 ENCOUNTER — Other Ambulatory Visit (HOSPITAL_COMMUNITY): Payer: Self-pay

## 2024-01-05 DIAGNOSIS — G4733 Obstructive sleep apnea (adult) (pediatric): Secondary | ICD-10-CM | POA: Diagnosis not present

## 2024-01-12 ENCOUNTER — Other Ambulatory Visit (HOSPITAL_COMMUNITY): Payer: Self-pay

## 2024-01-22 ENCOUNTER — Other Ambulatory Visit (HOSPITAL_COMMUNITY): Payer: Self-pay

## 2024-01-22 MED ORDER — MOUNJARO 2.5 MG/0.5ML ~~LOC~~ SOAJ
2.5000 mg | SUBCUTANEOUS | 0 refills | Status: DC
Start: 2024-01-22 — End: 2024-06-30
  Filled 2024-01-22: qty 2, 28d supply, fill #0

## 2024-01-23 ENCOUNTER — Other Ambulatory Visit (HOSPITAL_COMMUNITY): Payer: Self-pay

## 2024-02-01 ENCOUNTER — Other Ambulatory Visit: Payer: Self-pay | Admitting: *Deleted

## 2024-02-01 DIAGNOSIS — R591 Generalized enlarged lymph nodes: Secondary | ICD-10-CM

## 2024-02-09 ENCOUNTER — Inpatient Hospital Stay: Payer: Commercial Managed Care - PPO | Admitting: Hematology

## 2024-02-09 ENCOUNTER — Inpatient Hospital Stay: Payer: Commercial Managed Care - PPO

## 2024-02-19 ENCOUNTER — Other Ambulatory Visit: Payer: Self-pay

## 2024-02-19 ENCOUNTER — Other Ambulatory Visit (HOSPITAL_COMMUNITY): Payer: Self-pay

## 2024-02-19 MED ORDER — MOUNJARO 5 MG/0.5ML ~~LOC~~ SOAJ
5.0000 mg | SUBCUTANEOUS | 0 refills | Status: DC
  Filled 2024-02-19: qty 2, 28d supply, fill #0

## 2024-02-25 ENCOUNTER — Other Ambulatory Visit (HOSPITAL_COMMUNITY): Payer: Self-pay

## 2024-03-01 ENCOUNTER — Inpatient Hospital Stay

## 2024-03-01 ENCOUNTER — Inpatient Hospital Stay (HOSPITAL_BASED_OUTPATIENT_CLINIC_OR_DEPARTMENT_OTHER): Admitting: Hematology

## 2024-03-01 ENCOUNTER — Inpatient Hospital Stay: Attending: Hematology

## 2024-03-01 VITALS — BP 142/82 | HR 89 | Temp 98.1°F | Resp 20 | Wt 217.0 lb

## 2024-03-01 DIAGNOSIS — D649 Anemia, unspecified: Secondary | ICD-10-CM | POA: Diagnosis not present

## 2024-03-01 DIAGNOSIS — D61818 Other pancytopenia: Secondary | ICD-10-CM

## 2024-03-01 DIAGNOSIS — R591 Generalized enlarged lymph nodes: Secondary | ICD-10-CM

## 2024-03-01 DIAGNOSIS — Z7985 Long-term (current) use of injectable non-insulin antidiabetic drugs: Secondary | ICD-10-CM | POA: Diagnosis not present

## 2024-03-01 DIAGNOSIS — D509 Iron deficiency anemia, unspecified: Secondary | ICD-10-CM

## 2024-03-01 DIAGNOSIS — Z7982 Long term (current) use of aspirin: Secondary | ICD-10-CM | POA: Diagnosis not present

## 2024-03-01 DIAGNOSIS — E119 Type 2 diabetes mellitus without complications: Secondary | ICD-10-CM | POA: Diagnosis not present

## 2024-03-01 DIAGNOSIS — Z79899 Other long term (current) drug therapy: Secondary | ICD-10-CM | POA: Insufficient documentation

## 2024-03-01 DIAGNOSIS — F17210 Nicotine dependence, cigarettes, uncomplicated: Secondary | ICD-10-CM | POA: Insufficient documentation

## 2024-03-01 DIAGNOSIS — Z7984 Long term (current) use of oral hypoglycemic drugs: Secondary | ICD-10-CM | POA: Insufficient documentation

## 2024-03-01 DIAGNOSIS — I10 Essential (primary) hypertension: Secondary | ICD-10-CM | POA: Diagnosis not present

## 2024-03-01 LAB — CBC WITH DIFFERENTIAL (CANCER CENTER ONLY)
Abs Immature Granulocytes: 0 10*3/uL (ref 0.00–0.07)
Basophils Absolute: 0 10*3/uL (ref 0.0–0.1)
Basophils Relative: 1 %
Eosinophils Absolute: 0.1 10*3/uL (ref 0.0–0.5)
Eosinophils Relative: 2 %
HCT: 31.1 % — ABNORMAL LOW (ref 39.0–52.0)
Hemoglobin: 10.1 g/dL — ABNORMAL LOW (ref 13.0–17.0)
Immature Granulocytes: 0 %
Lymphocytes Relative: 30 %
Lymphs Abs: 0.9 10*3/uL (ref 0.7–4.0)
MCH: 23.5 pg — ABNORMAL LOW (ref 26.0–34.0)
MCHC: 32.5 g/dL (ref 30.0–36.0)
MCV: 72.3 fL — ABNORMAL LOW (ref 80.0–100.0)
Monocytes Absolute: 0.3 10*3/uL (ref 0.1–1.0)
Monocytes Relative: 9 %
Neutro Abs: 1.7 10*3/uL (ref 1.7–7.7)
Neutrophils Relative %: 58 %
Platelet Count: 204 10*3/uL (ref 150–400)
RBC: 4.3 MIL/uL (ref 4.22–5.81)
RDW: 18.5 % — ABNORMAL HIGH (ref 11.5–15.5)
WBC Count: 2.9 10*3/uL — ABNORMAL LOW (ref 4.0–10.5)
nRBC: 0 % (ref 0.0–0.2)

## 2024-03-01 LAB — IRON AND IRON BINDING CAPACITY (CC-WL,HP ONLY)
Iron: 29 ug/dL — ABNORMAL LOW (ref 45–182)
Saturation Ratios: 16 % — ABNORMAL LOW (ref 17.9–39.5)
TIBC: 185 ug/dL — ABNORMAL LOW (ref 250–450)
UIBC: 156 ug/dL (ref 117–376)

## 2024-03-01 LAB — CMP (CANCER CENTER ONLY)
ALT: 10 U/L (ref 0–44)
AST: 15 U/L (ref 15–41)
Albumin: 3.4 g/dL — ABNORMAL LOW (ref 3.5–5.0)
Alkaline Phosphatase: 107 U/L (ref 38–126)
Anion gap: 5 (ref 5–15)
BUN: 17 mg/dL (ref 6–20)
CO2: 26 mmol/L (ref 22–32)
Calcium: 8.6 mg/dL — ABNORMAL LOW (ref 8.9–10.3)
Chloride: 105 mmol/L (ref 98–111)
Creatinine: 0.93 mg/dL (ref 0.61–1.24)
GFR, Estimated: >60 mL/min (ref >60)
Glucose, Bld: 125 mg/dL — ABNORMAL HIGH (ref 70–99)
Potassium: 3.7 mmol/L (ref 3.5–5.1)
Sodium: 136 mmol/L (ref 135–145)
Total Bilirubin: 0.3 mg/dL (ref 0.0–1.2)
Total Protein: 8.6 g/dL — ABNORMAL HIGH (ref 6.5–8.1)

## 2024-03-01 LAB — SEDIMENTATION RATE: Sed Rate: 46 mm/h — ABNORMAL HIGH (ref 0–16)

## 2024-03-01 LAB — VITAMIN D 25 HYDROXY (VIT D DEFICIENCY, FRACTURES): Vit D, 25-Hydroxy: 42.44 ng/mL (ref 30–100)

## 2024-03-01 LAB — FERRITIN: Ferritin: 210 ng/mL (ref 24–336)

## 2024-03-01 LAB — C-REACTIVE PROTEIN: CRP: 2.4 mg/dL — ABNORMAL HIGH (ref <1.0)

## 2024-03-01 LAB — VITAMIN B12: Vitamin B-12: 283 pg/mL (ref 180–914)

## 2024-03-01 LAB — LACTATE DEHYDROGENASE: LDH: 101 U/L (ref 98–192)

## 2024-03-01 NOTE — Progress Notes (Signed)
 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  Patient's last visit with me was on 01/15/2022 and he was doing well overall. I also had a phone visit with the patient on 11/06/2022.   I last connected with patient on 02/10/2023 via telemedicine visit and reported seasonal allergies and mild occasional joint pain.  Patient reports that he has been doing well overall since his last visit. He report that he feels fairly well overall.   He reports that he was previously on Ozempic , but he was losing too much weight and it was not controlling his DM well. Patient started Mounjaro  1 month ago. He notes that his Mounjaro  dose started at 2.5 MG, then was increased to 5 MG BID, which seems to be controlling his blood sugars better. He reports that he has lost 80 pounds since January 2024.   Patient reports previous chest pain in February/March. After persistent symptoms, he was seen by Dr. Milon Aloe. CT chest scan on 12/01/2023 was negative for pulmonary embolism. Patient notes that at that time, he was found to have elevated Sed rate at 69 and D dimer elevated at 3.74. To manage his chest pain, he took 300 MG ibuprofen, which improved his pain.   Patient notes having a viral respiratory infection a week prior to his chest pain.   Patient took Colchicine  0.6 MG tablet twice daily for 2-3 weeks two weeks after his CT scan in March.   Patient  reports having a recent eye exam and has diagnosis of bilateral papilledema. He notes that he has had regular eye exams for the last 15 years, and this is a new change on routine exam.   He reports that he received a brain MRI with [facility] on 01/02/2024 which was negative. Patient reports that his ophthalmologist is still concerned despite normal brian MRI and he will follow-up with his eye doctor.   He reports that his visual fields are stable. Patient denies any new headache, falls, or vision changes.   He reports having malaria several times growing up. Patient denies any recurrences of malaria since being in the [place] . He notes that he had used Chloroquine frequently as a child.   He denies any family history of hemoglobin C.   He reports that his uncle, aunt, and 3 siblings have hx of sickle cell.   He denies swallowing issues.   Patient reports that he is currently smoking 3-5 cigarettes a day.   He plans to travel to [place] on Wednesday, 03/09/2024 and will return in 1 week.   Patient denies any lower abdominal pain or back pain.   Patient reports that he was seen by Dr. Joice Nares last week and his hgb was stable at 10.5.  He reports that his energy level has been normal. His breathing habits are normal with no SOB. Patient denies any skin rash, current chest pain, abdominal pain, or leg swelling.   Patient reports occasional joint pain which improves with moving around. He has no obvious joint  swelling.   He denies any dry mouth or dry eyes.   He reports that he has not been seen by a rheumatologist.   Patient is taking pepsid twice a day.   Patient presents iron labs from September 2024 which showed iron saturation 18% with low iron binding capacity. There were no Ferritin labs at that time.   Patient reports that he traveled outside of the [place]  for 4-5 days, primarily staying in a resort, and denies any concern for unusual infections.   MEDICAL  HISTORY:  Past Medical History:  Diagnosis Date   Diabetes (HCC)    GERD (gastroesophageal reflux disease)    Hypercholesteremia    Hypertension    Pneumonia    Sleep apnea   Hypertension Dyslipidemia Obstructive [name] GERD Left adrenal possible adenoma Glucose intolerance Obesity .There is no height or weight on file to calculate BMI. Tobacco use  SURGICAL HISTORY: Past Surgical History:  Procedure Laterality Date   AXILLARY LYMPH NODE BIOPSY Left 07/17/2021   Procedure: LEFT AXILLARY LYMPH NODE BIOPSY;  Surgeon: Enid Harry, MD;  [name];  Service: General;  Laterality: Left;    SOCIAL HISTORY: Social History   Socioeconomic History   Marital status: Married    Spouse name: Not on file   Number of children: Not on file   Years of education: Not on file   Highest education level: Not on file  Occupational History   Not on file  Tobacco Use   Smoking status: Some Days   Smokeless tobacco: Never   Tobacco comments:    Patient smokes rarely   Vaping Use   Vaping status: Never Used  Substance and Sexual Activity   Alcohol use: Yes    Comment: socailly   Drug use: No   Sexual activity: Not on file  Other Topics Concern   Not on file  Social History Narrative   Not on file   Social Drivers of Health   Financial Resource Strain: Not on file  Food Insecurity: Not on file  Transportation Needs: Not on file  Physical Activity: Not on file  Stress: Not on file  Social Connections: Not on file  Intimate Partner Violence: Not on file    FAMILY HISTORY: Mother had a history of some liver disease  ALLERGIES:  is allergic to chloroquine.  MEDICATIONS:  Current Outpatient Medications  Medication Sig Dispense Refill   albuterol  (VENTOLIN  HFA) 108 (90 Base) MCG/ACT inhaler Inhale 2 puffs into the lungs every 6 (six) hours as needed for shortness of breath and wheezing 6.7 g 0   aspirin 81 MG chewable tablet Chew by mouth daily.      colchicine  0.6 MG tablet Take 1 tablet (0.6 mg total) by mouth 2 (two) times daily for 3 weeks 42 tablet 1   Continuous Glucose Receiver (FREESTYLE LIBRE 3 READER) DEVI Use as directed 1 each 1   Continuous Glucose Sensor (FREESTYLE LIBRE 3 SENSOR) MISC Change sensor every 14 days as directed 24 each 3   dapagliflozin  propanediol (FARXIGA ) 10 MG TABS tablet Take 1 tablet (10 mg total) by mouth daily. 90 tablet 3   ergocalciferol  (VITAMIN D2) 1.25 MG (50000 UT) capsule Take 1 capsule (50,000 Units total) by mouth once a week. 12 capsule 1   famotidine (PEPCID) 20 MG tablet Take 20 mg by mouth 2 (two) times daily.     loratadine (CLARITIN) 10 MG tablet Take 10 mg by mouth daily.     losartan  (  COZAAR ) 25 MG tablet Take 1 tablet (25 mg total) by mouth daily. 90 tablet 3   metFORMIN  (GLUCOPHAGE -XR) 500 MG 24 hr tablet Take 2 tablets (1,000 mg total) by mouth with food 2 (two) times daily for 90 days. 360 tablet 3   rosuvastatin  (CRESTOR ) 10 MG tablet Take 1 tablet (10 mg total) by mouth daily. 90 tablet 3   Semaglutide , 1 MG/DOSE, (OZEMPIC , 1 MG/DOSE,) 4 MG/3ML SOPN Inject 1 mg into the skin once a week. 3 mL 1   tirzepatide  (MOUNJARO ) 2.5 MG/0.5ML Pen Inject 2.5 mg into the skin once a week. 2 mL 0   tirzepatide  (MOUNJARO ) 5 MG/0.5ML Pen Inject 5 mg into the skin once a week. 2 mL 0   No current facility-administered medications for this visit.    REVIEW OF SYSTEMS:   10 Point review of Systems was done is negative except as noted above.   PHYSICAL EXAMINATION:  .BP (!) 142/82   Pulse 89   Temp 98.1 F (36.7 C)   Resp 20   Wt 217 lb (98.4 kg)   SpO2 100%   BMI 27.86 kg/m   GENERAL:alert, in no acute distress and comfortable SKIN: no acute rashes, no significant lesions EYES: conjunctiva are pink and non-injected, sclera anicteric OROPHARYNX: MMM, no exudates, no oropharyngeal erythema or ulceration NECK: supple, no JVD LYMPH:  no palpable lymphadenopathy in the cervical, axillary or  inguinal regions LUNGS: clear to auscultation b/l with normal respiratory effort HEART: regular rate & rhythm ABDOMEN:  normoactive bowel sounds , non tender, not distended. Extremity: no pedal edema PSYCH: alert & oriented x 3 with fluent speech NEURO: no focal motor/sensory deficits   LABORATORY DATA:  I have reviewed the data as listed  .    Latest Ref Rng & Units 03/01/2024   11:22 AM 11/30/2023    6:05 PM 02/02/2023    9:33 AM  CBC  WBC 4.0 - 10.5 K/uL 2.9  3.4  3.7   Hemoglobin 13.0 - 17.0 g/dL 16.1  09.6  04.5   Hematocrit 39.0 - 52.0 % 31.1  33.5  36.1   Platelets 150 - 400 K/uL 204  214  160     .    Latest Ref Rng & Units 03/01/2024   11:22 AM 11/30/2023    6:05 PM 02/02/2023    9:33 AM  CMP  Glucose 70 - 99 mg/dL 409  811  914   BUN 6 - 20 mg/dL 17  15  9    Creatinine 0.61 - 1.24 mg/dL 7.82  9.56  2.13   Sodium 135 - 145 mmol/L 136  132  135   Potassium 3.5 - 5.1 mmol/L 3.7  4.2  3.7   Chloride 98 - 111 mmol/L 105  99  104   CO2 22 - 32 mmol/L 26  26  26    Calcium  8.9 - 10.3 mg/dL 8.6  9.3  8.6   Total Protein 6.5 - 8.1 g/dL 8.6  9.1  8.1   Total Bilirubin 0.0 - 1.2 mg/dL 0.3  0.3  0.4   Alkaline Phos 38 - 126 U/L 107  82  96   AST 15 - 41 U/L 15  17  14    ALT 0 - 44 U/L 10  11  11     . Lab Results  Component Value Date   LDH 101 03/01/2024     Component     Latest Ref Rng & Units 07/09/2021  Speckled Pattern  2,4,5,29 (H)  NOTE:      Comment  LDH     98 - 192 U/L 160  Magnesium      1.7 - 2.4 mg/dL 1.9  HCV Ab     0.0 - 0.9 s/co ratio <0.1 (A)  Hepatitis B Surface Ag     NON REACTIVE NON REACTIVE  Hep B Core Total Ab     NON REACTIVE NON REACTIVE  HIV Screen 4th Generation wRfx     Non Reactive Non Reactive  EBV VCA IgM     0.0 - 35.9 U/mL <36.0  EBV VCA IgG     0.0 - 17.9 U/mL 99.1 (H)  Vitamin D , 25-Hydroxy     30 - 100 ng/mL 31.27  ANA Ab, IFA      Positive (A)  CMV IgM     0.0 - 29.9 AU/mL <30.0  CMV Ab - IgG     0.00 - 0.59 U/mL  7.00 (H)   FANA Staining Patterns Order: [id number] Status: Final result   Visible to patient: Yes (seen)   Next appt: 01/08/2022 at 09:30 AM in Oncology ([facility])   0 Result Notes Component 3 wk ago   Speckled Pattern 2,4,5,29 High    Comment: 1:1280  ICAP nomenclature: [facility]      SURGICAL PATHOLOGY  CASE: [id number]  PATIENT: Ardean Pawling  Surgical Pathology Report      Clinical History: left axillary and generalized adenopathy    FINAL MICROSCOPIC DIAGNOSIS:   A. LYMPH NODE, LEFT AXILLARY, EXCISION:  -  Enlarged lymph nodes with progressive transformation of germinal  centers  -  No morphologic or immunophenotypic evidence of a lymphoproliferative  process  -  See comment   COMMENT:   The excision specimen consists of five lymph nodes of varying sizes.  Overall nodal architecture is preserved.  The most notable feature is  enlarged germinal centers with irregular margins.  By  immunohistochemistry, the germinal centers are positive for CD20, CD10,  BCL6, but negative for Bcl-2 and cyclin D1.  CD23 highlights expanded  follicular dendritic meshworks.  Ki-67 highlights preserved polarization  within a large portion of the germinal centers.  EBV by in situ  hybridization is negative. CD30 highlights scattered enlarged  lymphocytes which do not express CD15, these are favored to represent  activated immunoblasts.  CD3 and CD5 highlight background T cells.  CD68  highlights histiocytes.  Flow cytometry performed on the sample (see  [id number]) identified a kappa-predominant CD10 positive B-cell  population that comprised 10% of all lymphocytes.  The significance of  this is uncertain given the absence of morphologic features.   Overall, the findings are favored to represent progressive  transformation of germinal centers.  There are no definitive features  present to suggest a lymphoproliferative process; however, given the  presence of a kappa  predominant population by flow cytometry, B-cell  clonality studies and FISH t(14;18)) are pending and will be reported in  an addendum.  Dr. Tish Forge (hematopathologist) reviewed the case and  agrees with the above diagnosis.    RADIOGRAPHIC STUDIES: I have personally reviewed the radiological images as listed and agreed with the findings in the report. No results found.  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 No obvious symptoms no recent viral infection or febrile illness. No obvious symptoms suggestive of autoimmune condition.  He  did have some arthralgias and myalgias but these resolved with electrolyte replacement.  2) HTN 3) HLD 4) OSA on CPAP 5) Smoking 6) left adrenal gland nodular thickening  PLAN:  -Discussed lab results on 03/01/24 in detail with patient. CBC showed WBC of 2.9K, hemoglobin of 10.1, and platelets of 204K. -LDH today is WNL at 40 -patient is more anemic with hgb 10.1 today; previously 11.6 one year ago -his anemia is unlikely to be from iron deficiency and more likely to be from chronic inflammation, thalassemia trait, or a hemoglobinopathy trait -Since he is more anemic, we would want to evaluate his iron levels to see if there is any iron deficiency -discussed option of oral/IV iron to correct his anemia with ferritin goal of 250-500 and iron saturation goal of at least 20% -discussed that use of acid suppressants would be a reason for deficiencies -WBCs are mildly low today at 2.9K. His WBCs generally fluctuate in low-normal range and noted to be 3.1K in January 2024 -there is no neutropenia -platelets are normal. Platelets noted to previously be low in the past -he is still fairly microcytic with MCV 72.3 fL -TSH one month ago normal at 0.605 -patient noted  to be ANA positive -His sedimentation rate noted to be elevated at 69 in early March 2025. Previous labs show sed rate in 20s in 2024 and was normal in April 2023.  -his RBCs are higher than his Hgb would suggest, which usually suggests a thalasemic trait of some sort -MCV noted to be previously 80 on oldest labs in system from April 2023, and has since worsened, ranging in 70s in the last 2 years -his ferritin was noted to be in 200s in January 2024. We discussed that ferritin can be over-read if there is inflammation -inflammatory markers, including Sed rate and LDH from today are pending -lymph nodes are fairly unchanged based on physical examination with no obvious changes in lymph nodes.  -did not feel an enlarged liver or spleen during physical examination -discussed that there is some inflammation present with his higher sedimentation rate and elevated igG levels  -discussed possibility of Still's disease, which could present with cytopenia and reactive inflammation  -discussed possibility of esoteric inflammatory disorders -discussed possibility of immunologic disorder -there is no concern for acute leukemia or rapidly-moving lymphoma. Discussed that there may be unusual changes related to low-grade lymphoma, which biopsy would evaluate for -discussed that if there were acute reactive chances from viral infection, they should have resolved completely by now, so it is unlikely that viral infection is the only explanation -discussed that while Colchicine  can suppress the bone marrow, patient has been off of this medication for more than 2 months -viral infection could cause bone marrow suppression -discussed option of bone marrow biopsy to further evaluate changes in blood counts and elevated inflammatory markers if other testing does not explain this process.  -discussed conservative option to only monitor changes without bone marrow biopsy -patient is agreeable to blood tests, including  iron studies, hgb electroporesis, B12, and SPEP -will order iron labs and Hgb electrophoresis today  -discussed that it would not be unreasonable to consult with a rheumatologist if there is no explanation of his significant inflammation from a hematologic standpoint -patient noted to have some ocular changes which are not yet explained -continue to follow with ophthalmologist to evaluate vision changes -answered all of patient's questions in detail  . Orders Placed This Encounter  Procedures   CT BONE MARROW BIOPSY & ASPIRATION  Standing Status:   Future    Expected Date:   03/22/2024    Expiration Date:   03/01/2025    Reason for Exam (SYMPTOM  OR DIAGNOSIS REQUIRED):   Patient with generalozed lymphadenopathy with progressive anemia and leucopenia ?BM involvement with lymphoma    Preferred location?:   [facility]   C-reactive protein    Standing Status:   Future    Number of Occurrences:   1    Expected Date:   03/01/2024    Expiration Date:   03/01/2025   Hgb Fractionation Cascade    Standing Status:   Future    Number of Occurrences:   1    Expected Date:   03/01/2024    Expiration Date:   03/01/2025   Alpha-Thalassemia GenotypR    Standing Status:   Future    Number of Occurrences:   1    Expected Date:   03/01/2024    Expiration Date:   03/01/2025   Ferritin    Standing Status:   Future    Number of Occurrences:   1    Expected Date:   03/01/2024    Expiration Date:   03/01/2025   Iron and Iron Binding Capacity ([facility])    Standing Status:   Future    Number of Occurrences:   1    Expected Date:   03/01/2024    Expiration Date:   03/01/2025   Multiple Myeloma Panel (SPEP&IFE w/QIG)    Standing Status:   Future    Number of Occurrences:   1    Expected Date:   03/01/2024    Expiration Date:   03/01/2025   Kappa/lambda light chains    Standing Status:   Future    Number of Occurrences:   1    Expected Date:   03/01/2024    Expiration Date:   03/01/2025   Vitamin B12     Standing Status:   Future    Number of Occurrences:   1    Expected Date:   03/01/2024    Expiration Date:   03/01/2025   Rheumatoid Arthritis Profile    Standing Status:   Future    Number of Occurrences:   1    Expiration Date:   03/01/2025   ANA+ENA+DNA/DS+Scl 70+SjoSSA/B    Standing Status:   Future    Number of Occurrences:   1    Expected Date:   03/01/2024    Expiration Date:   03/01/2025    FOLLOW-UP: Additional labs today CT bone marrow aspiration and biopsy in 3 weeks RTC with Dr Salomon Cree in 4-5 weeks  The total time spent in the appointment was 30 minutes* .  All of the patient's questions were answered with apparent satisfaction. The patient knows to call the clinic with any problems, questions or concerns.   Jacquelyn Matt MD MS AAHIVMS [facility] Hematology/Oncology Physician [facility]  .*Total Encounter Time as defined by the Centers for Medicare and Medicaid Services includes, in addition to the face-to-face time of a patient visit (documented in the note above) non-face-to-face time: obtaining and reviewing outside history, ordering and reviewing medications, tests or procedures, care coordination (communications with other health care professionals or caregivers) and documentation in the medical record.    I,Mitra Faeizi,acting as a Neurosurgeon for Jacquelyn Matt, MD.,have documented all relevant documentation on the behalf of Jacquelyn Matt, MD,as directed by  Jacquelyn Matt, MD while in the presence of Quavion Boule,  MD.  .I have reviewed the above documentation for accuracy and completeness, and I agree with the above. .Tarin Navarez Kishore Cha Gomillion MD

## 2024-03-02 LAB — KAPPA/LAMBDA LIGHT CHAINS
Kappa free light chain: 72.9 mg/L — ABNORMAL HIGH (ref 3.3–19.4)
Kappa, lambda light chain ratio: 1.53 (ref 0.26–1.65)
Lambda free light chains: 47.8 mg/L — ABNORMAL HIGH (ref 5.7–26.3)

## 2024-03-02 LAB — RHEUMATOID ARTHRITIS PROFILE
CCP Antibodies IgG/IgA: 7 U (ref 0–19)
Rheumatoid fact SerPl-aCnc: <10 [IU]/mL (ref <14.0)

## 2024-03-03 LAB — MULTIPLE MYELOMA PANEL, SERUM
Albumin SerPl Elph-Mcnc: 3.2 g/dL (ref 2.9–4.4)
Albumin/Glob SerPl: 0.7 (ref 0.7–1.7)
Alpha 1: 0.2 g/dL (ref 0.0–0.4)
Alpha2 Glob SerPl Elph-Mcnc: 0.9 g/dL (ref 0.4–1.0)
B-Globulin SerPl Elph-Mcnc: 0.8 g/dL (ref 0.7–1.3)
Gamma Glob SerPl Elph-Mcnc: 3.2 g/dL — ABNORMAL HIGH (ref 0.4–1.8)
Globulin, Total: 5 g/dL — ABNORMAL HIGH (ref 2.2–3.9)
IgA: 242 mg/dL (ref 90–386)
IgG (Immunoglobin G), Serum: 3615 mg/dL — ABNORMAL HIGH (ref 603–1613)
IgM (Immunoglobulin M), Srm: 38 mg/dL (ref 20–172)
Total Protein ELP: 8.2 g/dL (ref 6.0–8.5)

## 2024-03-04 ENCOUNTER — Ambulatory Visit: Admitting: Hematology

## 2024-03-04 ENCOUNTER — Other Ambulatory Visit

## 2024-03-06 LAB — HGB FRACTIONATION CASCADE
Hgb A2: 2.7 % (ref 1.8–3.2)
Hgb A: 97.3 % (ref 96.4–98.8)
Hgb F: 0 % (ref 0.0–2.0)
Hgb S: 0 %

## 2024-03-07 ENCOUNTER — Other Ambulatory Visit (HOSPITAL_COMMUNITY): Payer: Self-pay

## 2024-03-07 ENCOUNTER — Other Ambulatory Visit: Payer: Self-pay

## 2024-03-07 MED ORDER — ROSUVASTATIN CALCIUM 10 MG PO TABS
10.0000 mg | ORAL_TABLET | Freq: Every day | ORAL | 3 refills | Status: AC
  Filled 2024-03-07: qty 90, 90d supply, fill #0
  Filled 2024-06-08: qty 90, 90d supply, fill #1
  Filled 2024-09-07: qty 90, 90d supply, fill #2

## 2024-03-08 ENCOUNTER — Other Ambulatory Visit (HOSPITAL_COMMUNITY): Payer: Self-pay

## 2024-03-16 LAB — ALPHA-THALASSEMIA ANALYSIS: IMAGE: 0

## 2024-03-16 LAB — ALPHA-THALASSEMIA GENOTYPR

## 2024-03-21 ENCOUNTER — Other Ambulatory Visit: Payer: Self-pay

## 2024-03-21 ENCOUNTER — Other Ambulatory Visit (HOSPITAL_COMMUNITY): Payer: Self-pay

## 2024-03-21 MED ORDER — FREESTYLE LIBRE 3 PLUS SENSOR MISC
3 refills | Status: AC
  Filled 2024-03-21: qty 6, 90d supply, fill #0
  Filled 2024-06-09: qty 6, 90d supply, fill #1
  Filled 2024-09-07: qty 6, 90d supply, fill #2

## 2024-03-21 MED ORDER — MOUNJARO 5 MG/0.5ML ~~LOC~~ SOAJ
5.0000 mg | SUBCUTANEOUS | 0 refills | Status: DC
  Filled 2024-03-21: qty 2, 28d supply, fill #0

## 2024-03-22 ENCOUNTER — Other Ambulatory Visit (HOSPITAL_COMMUNITY): Payer: Self-pay

## 2024-03-24 ENCOUNTER — Other Ambulatory Visit (HOSPITAL_COMMUNITY): Payer: Self-pay

## 2024-03-24 DIAGNOSIS — H4710 Unspecified papilledema: Secondary | ICD-10-CM | POA: Diagnosis not present

## 2024-03-25 ENCOUNTER — Other Ambulatory Visit (HOSPITAL_COMMUNITY)

## 2024-03-25 ENCOUNTER — Other Ambulatory Visit: Payer: Self-pay | Admitting: Interventional Radiology

## 2024-03-25 DIAGNOSIS — Z01818 Encounter for other preprocedural examination: Secondary | ICD-10-CM

## 2024-03-27 NOTE — H&P (Signed)
 Chief Complaint: Patient was seen in consultation today for lymphoma evaluation in the setting of persistent lymphadenopathy, with consideration for bone marrow biopsy.  Referring Provider(s): Dr. Emaline Saran, MD   Supervising Physician: Johann Sieving  Patient Status: [facility] - Out-pt  Patient is Full Code  History of Present Illness: Mark Nichols is a 53 y.o. male  with PMHx notable for HTN, HLD, DM, OSA, and GERD.  Per Dr. Maryla progress note on 6/3: Assessment: 1) Anterior mediastinal and bilateral subpectoral and axillary lymphadenopathy Incidentally noted on CT coronary angiogram No obvious constitutional symptoms. No obvious symptoms no recent viral infection or febrile illness. No obvious symptoms suggestive of autoimmune condition.  He did have some arthralgias and myalgias but these resolved with electrolyte replacement.  Interventional Radiology was requested for bone marrow biopsy and aspiration. Patient is scheduled for same in IR today.   Patient is alert and laying in bed, calm.  Patient is currently without any significant complaints.  Patient denies any fevers, headache, chest pain, SOB, cough, abdominal pain, nausea, vomiting or bleeding.     Past Medical History:  Diagnosis Date   Diabetes (HCC)    GERD (gastroesophageal reflux disease)    Hypercholesteremia    Hypertension    Pneumonia    Sleep apnea     Past Surgical History:  Procedure Laterality Date   AXILLARY LYMPH NODE BIOPSY Left 07/17/2021   Procedure: LEFT AXILLARY LYMPH NODE BIOPSY;  Surgeon: Ebbie Cough, MD;  [name];  Service: General;  Laterality: Left;    Allergies: Chloroquine  Medications: Prior to Admission medications   Medication Sig Start Date End Date Taking? Authorizing Provider  albuterol  (VENTOLIN  HFA) 108 (90 Base) MCG/ACT inhaler Inhale 2 puffs into the lungs every 6 (six) hours as needed for shortness of breath and wheezing 09/10/23   Jude Harden GAILS, MD  aspirin 81 MG chewable tablet Chew by mouth daily.    [provider]  colchicine  0.6 MG tablet Take 1 tablet (0.6 mg total) by mouth 2 (two) times daily for 3 weeks 12/11/23   Singh, Prashant K, MD  Continuous Glucose Receiver (FREESTYLE LIBRE 3 READER) DEVI Use as directed 02/18/23     Continuous Glucose Sensor (FREESTYLE LIBRE 3 PLUS SENSOR) MISC Apply to upper arm every 15 days. 03/21/24   Rexanne Ingle, MD  Continuous Glucose Sensor (FREESTYLE LIBRE 3 SENSOR) MISC Change sensor every 14 days as directed 02/18/23   Rexanne Ingle, MD  dapagliflozin  propanediol (FARXIGA ) 10 MG TABS tablet Take 1 tablet (10 mg total) by mouth daily. 11/12/23     ergocalciferol  (VITAMIN D2) 1.25 MG (50000 UT) capsule Take 1 capsule (50,000 Units total) by mouth once a week. 11/05/22   Saran Emaline Brink, MD  famotidine (PEPCID) 20 MG tablet Take 20 mg by mouth 2 (two) times daily.    [provider]  loratadine (CLARITIN) 10 MG tablet Take 10 mg by mouth daily.    [provider]  losartan  (COZAAR ) 25 MG tablet Take 1 tablet (25 mg total) by mouth daily. 02/18/23     metFORMIN  (GLUCOPHAGE -XR) 500 MG 24 hr tablet Take 2 tablets (1,000 mg total) by mouth with food 2 (two) times daily for 90 days. 11/04/23     rosuvastatin  (CRESTOR ) 10 MG tablet Take 1 tablet (10 mg total) by mouth daily. 03/07/24     Semaglutide , 1 MG/DOSE, (OZEMPIC , 1 MG/DOSE,) 4 MG/3ML SOPN Inject 1 mg into the skin once a week. 11/04/23  tirzepatide  (MOUNJARO ) 2.5 MG/0.5ML Pen Inject 2.5 mg into the skin once a week. 01/22/24     tirzepatide  (MOUNJARO ) 5 MG/0.5ML Pen Inject 5 mg into the skin once a week. 03/21/24        Family History  Problem Relation Age of Onset   Coronary artery disease Mother    Stroke Father     Social History   Socioeconomic History   Marital status: Married    Spouse name: Not on file   Number of children: Not on file   Years of education: Not on file   Highest education level:  Not on file  Occupational History   Not on file  Tobacco Use   Smoking status: Some Days   Smokeless tobacco: Never   Tobacco comments:    Patient smokes rarely   Vaping Use   Vaping status: Never Used  Substance and Sexual Activity   Alcohol use: Yes    Comment: socailly   Drug use: No   Sexual activity: Not on file  Other Topics Concern   Not on file  Social History Narrative   Not on file   Social Drivers of Health   Financial Resource Strain: Not on file  Food Insecurity: Not on file  Transportation Needs: Not on file  Physical Activity: Not on file  Stress: Not on file  Social Connections: Not on file     Review of Systems: A 12 point ROS discussed and pertinent positives are indicated in the HPI above.  All other systems are negative.  Vital Signs: BP (!) 140/85 (BP Location: Right Arm)   Pulse 76   Temp 98 F (36.7 C) (Oral)   Resp 16   SpO2 100%   Advance Care Plan: The advanced care place/surrogate decision maker was discussed at the time of visit and the patient did not wish to discuss or was not able to name a surrogate decision maker or provide an advance care plan.  Physical Exam Vitals reviewed.  Constitutional:      General: He is not in acute distress.    Appearance: Normal appearance.  HENT:     Mouth/Throat:     Mouth: Mucous membranes are dry.   Cardiovascular:     Rate and Rhythm: Normal rate and regular rhythm.     Pulses: Normal pulses.  Pulmonary:     Effort: Pulmonary effort is normal.     Breath sounds: Normal breath sounds.  Abdominal:     General: Abdomen is flat.   Musculoskeletal:        General: Normal range of motion.     Cervical back: Normal range of motion.   Skin:    General: Skin is warm and dry.   Neurological:     Mental Status: He is alert and oriented to person, place, and time.   Psychiatric:        Mood and Affect: Mood normal.        Behavior: Behavior normal.        Thought Content: Thought content  normal.        Judgment: Judgment normal.     Imaging: No results found.  Labs:  CBC: Recent Labs    11/30/23 1805 03/01/24 1122 03/28/24 0825  WBC 3.4* 2.9* 3.2*  HGB 10.6* 10.1* 10.9*  HCT 33.5* 31.1* 36.1*  PLT 214 204 211    COAGS: No results for input(s): INR, APTT in the last 8760 hours.  BMP: Recent Labs    11/30/23  1805 03/01/24 1122  NA 132* 136  K 4.2 3.7  CL 99 105  CO2 26 26  GLUCOSE 133* 125*  BUN 15 17  CALCIUM  9.3 8.6*  CREATININE 1.01 0.93  GFRNONAA >60 >60    LIVER FUNCTION TESTS: Recent Labs    11/30/23 1805 03/01/24 1122  BILITOT 0.3 0.3  AST 17 15  ALT 11 10  ALKPHOS 82 107  PROT 9.1* 8.6*  ALBUMIN 3.3* 3.4*    TUMOR MARKERS: No results for input(s): AFPTM, CEA, CA199, CHROMGRNA in the last 8760 hours.  Assessment and Plan: Per Dr. Maryla progress note on 6/3: Plan: [...]-there is no concern for acute leukemia or rapidly-moving lymphoma. Discussed that there may be unusual changes related to low-grade lymphoma, which biopsy would evaluate for -discussed that if there were acute reactive chances from viral infection, they should have resolved completely by now, so it is unlikely that viral infection is the only explanation -discussed that while Colchicine  can suppress the bone marrow, patient has been off of this medication for more than 2 months -viral infection could cause bone marrow suppression -discussed option of bone marrow biopsy to further evaluate changes in blood counts and elevated inflammatory markers if other testing does not explain this process.  -discussed conservative option to only monitor changes without bone marrow biopsy   Patient presents for scheduled bone marrow biopsy and aspiration in IR today.  Patient has been NPO since midnight.  All labs and medications are within acceptable parameters. Patient's CBG this Am was 53. 1 ampule of D50 given. No pertinent allergies.   Risks and  benefits of bone marrow biopsy and aspiration was discussed with the patient and/or patient's family including, but not limited to bleeding, infection, damage to adjacent structures or low yield requiring additional tests.  All of the questions were answered and there is agreement to proceed.  Consent signed and in chart.      Thank you for allowing our service to participate in Mark Nichols 's care.  Electronically Signed: Carlin DELENA Griffon, PA-C   03/28/2024, 8:43 AM      I spent a total of 30 Minutes in face to face in clinical consultation, greater than 50% of which was counseling/coordinating care for lymphoma evaluation in the setting of persistent lymphadenopathy, with consideration for bone marrow biopsy.

## 2024-03-28 ENCOUNTER — Ambulatory Visit (HOSPITAL_COMMUNITY)
Admission: RE | Admit: 2024-03-28 | Discharge: 2024-03-28 | Disposition: A | Discharge status: Home / Self Care | Source: Ambulatory Visit | Attending: Hematology | Admitting: Hematology

## 2024-03-28 ENCOUNTER — Encounter (HOSPITAL_COMMUNITY): Payer: Self-pay

## 2024-03-28 DIAGNOSIS — E119 Type 2 diabetes mellitus without complications: Secondary | ICD-10-CM | POA: Diagnosis not present

## 2024-03-28 DIAGNOSIS — Z7985 Long-term (current) use of injectable non-insulin antidiabetic drugs: Secondary | ICD-10-CM | POA: Insufficient documentation

## 2024-03-28 DIAGNOSIS — R591 Generalized enlarged lymph nodes: Secondary | ICD-10-CM | POA: Insufficient documentation

## 2024-03-28 DIAGNOSIS — D6489 Other specified anemias: Secondary | ICD-10-CM | POA: Diagnosis not present

## 2024-03-28 DIAGNOSIS — K219 Gastro-esophageal reflux disease without esophagitis: Secondary | ICD-10-CM | POA: Insufficient documentation

## 2024-03-28 DIAGNOSIS — E7800 Pure hypercholesterolemia, unspecified: Secondary | ICD-10-CM | POA: Diagnosis not present

## 2024-03-28 DIAGNOSIS — D72822 Plasmacytosis: Secondary | ICD-10-CM | POA: Diagnosis not present

## 2024-03-28 DIAGNOSIS — D61818 Other pancytopenia: Secondary | ICD-10-CM

## 2024-03-28 DIAGNOSIS — D72819 Decreased white blood cell count, unspecified: Secondary | ICD-10-CM | POA: Insufficient documentation

## 2024-03-28 DIAGNOSIS — G4733 Obstructive sleep apnea (adult) (pediatric): Secondary | ICD-10-CM | POA: Diagnosis not present

## 2024-03-28 DIAGNOSIS — I10 Essential (primary) hypertension: Secondary | ICD-10-CM | POA: Diagnosis not present

## 2024-03-28 DIAGNOSIS — Z7984 Long term (current) use of oral hypoglycemic drugs: Secondary | ICD-10-CM | POA: Diagnosis not present

## 2024-03-28 DIAGNOSIS — Z01818 Encounter for other preprocedural examination: Secondary | ICD-10-CM

## 2024-03-28 DIAGNOSIS — D649 Anemia, unspecified: Secondary | ICD-10-CM | POA: Insufficient documentation

## 2024-03-28 LAB — CBC WITH DIFFERENTIAL/PLATELET
Abs Immature Granulocytes: 0.01 10*3/uL (ref 0.00–0.07)
Basophils Absolute: 0 10*3/uL (ref 0.0–0.1)
Basophils Relative: 1 %
Eosinophils Absolute: 0.1 10*3/uL (ref 0.0–0.5)
Eosinophils Relative: 3 %
HCT: 36.1 % — ABNORMAL LOW (ref 39.0–52.0)
Hemoglobin: 10.9 g/dL — ABNORMAL LOW (ref 13.0–17.0)
Immature Granulocytes: 0 %
Lymphocytes Relative: 31 %
Lymphs Abs: 1 10*3/uL (ref 0.7–4.0)
MCH: 23.1 pg — ABNORMAL LOW (ref 26.0–34.0)
MCHC: 30.2 g/dL (ref 30.0–36.0)
MCV: 76.6 fL — ABNORMAL LOW (ref 80.0–100.0)
Monocytes Absolute: 0.3 10*3/uL (ref 0.1–1.0)
Monocytes Relative: 10 %
Neutro Abs: 1.8 10*3/uL (ref 1.7–7.7)
Neutrophils Relative %: 55 %
Platelets: 211 10*3/uL (ref 150–400)
RBC: 4.71 MIL/uL (ref 4.22–5.81)
RDW: 19.1 % — ABNORMAL HIGH (ref 11.5–15.5)
WBC: 3.2 10*3/uL — ABNORMAL LOW (ref 4.0–10.5)
nRBC: 0 % (ref 0.0–0.2)

## 2024-03-28 LAB — GLUCOSE, CAPILLARY
Glucose-Capillary: 53 mg/dL — ABNORMAL LOW (ref 70–99)
Glucose-Capillary: 142 mg/dL — ABNORMAL HIGH (ref 70–99)

## 2024-03-28 LAB — PROTIME-INR
INR: 0.9 (ref 0.8–1.2)
Prothrombin Time: 12.9 s (ref 11.4–15.2)

## 2024-03-28 MED ORDER — NALOXONE HCL 0.4 MG/ML IJ SOLN
INTRAMUSCULAR | Status: AC
  Filled 2024-03-28: qty 1

## 2024-03-28 MED ORDER — FENTANYL CITRATE (PF) 100 MCG/2ML IJ SOLN
INTRAMUSCULAR | Status: AC | PRN
  Administered 2024-03-28: 50 ug via INTRAVENOUS

## 2024-03-28 MED ORDER — MIDAZOLAM HCL 2 MG/2ML IJ SOLN
INTRAMUSCULAR | Status: AC | PRN
  Administered 2024-03-28: 1 mg via INTRAVENOUS

## 2024-03-28 MED ORDER — FLUMAZENIL 0.5 MG/5ML IV SOLN
INTRAVENOUS | Status: AC
  Filled 2024-03-28: qty 5

## 2024-03-28 MED ORDER — DEXTROSE 50 % IV SOLN
INTRAVENOUS | Status: AC
  Filled 2024-03-28: qty 50

## 2024-03-28 MED ORDER — FENTANYL CITRATE (PF) 100 MCG/2ML IJ SOLN
INTRAMUSCULAR | Status: AC
  Filled 2024-03-28: qty 2

## 2024-03-28 MED ORDER — SODIUM CHLORIDE 0.9 % IV SOLN: INTRAVENOUS | Status: DC

## 2024-03-28 MED ORDER — DEXTROSE 50 % IV SOLN
1.0000 | Freq: Once | INTRAVENOUS | Status: AC
  Administered 2024-03-28: 50 mL via INTRAVENOUS

## 2024-03-28 MED ORDER — MIDAZOLAM HCL 2 MG/2ML IJ SOLN
INTRAMUSCULAR | Status: AC
Start: 2024-03-28 — End: 2024-03-28
  Filled 2024-03-28: qty 4

## 2024-03-28 MED ORDER — HYDROCODONE-ACETAMINOPHEN 5-325 MG PO TABS: 1.0000 | ORAL_TABLET | ORAL | Status: DC | PRN

## 2024-03-28 MED ORDER — LIDOCAINE HCL 1 % IJ SOLN
INTRAMUSCULAR | Status: AC | PRN
  Administered 2024-03-28: 10 mL via INTRADERMAL

## 2024-03-28 NOTE — Discharge Instructions (Signed)
 PLEASE CALL INTERVENTIONAL RADIOLOGY CLINIC [phone] WITH ANY QUESTION OR CONCERNS   YOU MAY REMOVE YOUR DRESSING AND SHOWER TOMORROW

## 2024-03-28 NOTE — Procedures (Signed)
  Procedure:  CT bone marrow biopsy R iliac Preprocedure diagnosis: Diagnoses of Generalized lymphadenopathy, Other pancytopenia (HCC), and Preop testing were pertinent to this visit. Postprocedure diagnosis: same EBL:    minimal Complications:   none immediate  See full dictation in [employer].  CHARM Toribio Faes MD Main # [phone] Pager  [phone] Mobile [phone]

## 2024-03-29 ENCOUNTER — Inpatient Hospital Stay

## 2024-03-29 ENCOUNTER — Inpatient Hospital Stay: Admitting: Hematology

## 2024-03-31 LAB — SURGICAL PATHOLOGY

## 2024-04-06 DIAGNOSIS — G4733 Obstructive sleep apnea (adult) (pediatric): Secondary | ICD-10-CM | POA: Diagnosis not present

## 2024-04-11 ENCOUNTER — Encounter (HOSPITAL_COMMUNITY): Payer: Self-pay | Admitting: Hematology

## 2024-04-18 ENCOUNTER — Other Ambulatory Visit (HOSPITAL_COMMUNITY): Payer: Self-pay

## 2024-04-18 MED ORDER — MOUNJARO 7.5 MG/0.5ML ~~LOC~~ SOAJ
7.5000 mg | SUBCUTANEOUS | 0 refills | Status: DC
  Filled 2024-04-18: qty 2, 28d supply, fill #0

## 2024-04-19 ENCOUNTER — Other Ambulatory Visit (HOSPITAL_COMMUNITY): Payer: Self-pay

## 2024-04-19 LAB — SURGICAL PATHOLOGY

## 2024-04-20 ENCOUNTER — Other Ambulatory Visit (HOSPITAL_COMMUNITY): Payer: Self-pay

## 2024-04-21 ENCOUNTER — Other Ambulatory Visit (HOSPITAL_COMMUNITY): Payer: Self-pay

## 2024-04-25 ENCOUNTER — Other Ambulatory Visit: Payer: Self-pay

## 2024-04-25 DIAGNOSIS — I10 Essential (primary) hypertension: Secondary | ICD-10-CM | POA: Diagnosis not present

## 2024-04-25 DIAGNOSIS — D509 Iron deficiency anemia, unspecified: Secondary | ICD-10-CM | POA: Diagnosis not present

## 2024-04-25 DIAGNOSIS — E279 Disorder of adrenal gland, unspecified: Secondary | ICD-10-CM | POA: Diagnosis not present

## 2024-04-25 DIAGNOSIS — Z833 Family history of diabetes mellitus: Secondary | ICD-10-CM | POA: Diagnosis not present

## 2024-04-25 DIAGNOSIS — E119 Type 2 diabetes mellitus without complications: Secondary | ICD-10-CM | POA: Diagnosis not present

## 2024-04-25 DIAGNOSIS — R591 Generalized enlarged lymph nodes: Secondary | ICD-10-CM

## 2024-04-25 NOTE — Progress Notes (Signed)
 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  Patient's last visit with me was on 03/01/2024 and he reported an 80 pound weight loss over 18 months with weight loss medication, occasional joint pain, and new diagnosis of bilateral papilledema.   Patient is noted to have received a lumbar puncture on 03/24/2024. He has also seen neurology since our last visit.   He reports that he feels well overall. Patient notes previous weight loss. He reports that his energy levels has mildly improved.   Patient is noted to have bilateral papilledema. Patient reports normal pressure levels in this regard. He continues to follow-up with his ophthalmologist. Patient reports that he believes his bilateral papilledema is from DM due to its bilateral nature. He denies any vision changes. He continues to be on metformin .   His 01/02/2024 brain MRI showed No acute intracranial abnormality. No intracranial space-occupying lesion identified to account for reported papilledema. There were findings of enlargement of the extraocular muscles which radiologist thought was suggestive of thyroid  eye disease.   He notes that he previously took Colchicine  0.6 MG twice daily for 3 weeks. He is no longer taking Colchicine .   Patient notes that he is on Pepsid.   He reports that he has never had thyroid   issues.   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   He denies any new lymph nodes, fever, chills, or night sweats.   Patient reports regular aches and pains which are occasional. He denies any unusual or debilitating discomfort. Patient denies any skin rashes, joint pain, mouth sores, or any other localizable symptomology. He notes that his ANA level was positive on previous labs. Patient reports that he has never seen a rheumatologist. Patient denies any fhx of autoimmune conditions such as lupus or RA.   He denies any new skin changes. Patient does note some dry skin.   Patient notes that he is UTD with his endoscopies and colonoscopies.   He notes that he recently traveled to [place] and enjoyed his time.   MEDICAL HISTORY:  Past Medical History:  Diagnosis Date   Diabetes (HCC)    GERD (gastroesophageal reflux disease)    Hypercholesteremia    Hypertension    Pneumonia    Sleep apnea   Hypertension Dyslipidemia Obstructive [name] GERD Left adrenal possible adenoma Glucose intolerance Obesity .Body mass index is 27.36 kg/m. Tobacco use  SURGICAL HISTORY: Past Surgical History:  Procedure Laterality Date   AXILLARY LYMPH NODE BIOPSY Left 07/17/2021   Procedure: LEFT AXILLARY LYMPH NODE BIOPSY;  Surgeon: Ebbie Cough, MD;  [name];  Service: General;  Laterality: Left;    SOCIAL HISTORY: Social History   Socioeconomic History   Marital status: Married    Spouse name: Not on file   Number of children: Not on file   Years of education: Not on file   Highest education level: Not on file  Occupational History   Not on file  Tobacco Use   Smoking status: Some Days   Smokeless tobacco: Never   Tobacco comments:    Patient smokes rarely   Vaping Use   Vaping status: Never Used  Substance and Sexual Activity   Alcohol use: Yes    Comment: socailly   Drug use: No   Sexual activity: Not on file  Other Topics  Concern   Not on file  Social History Narrative   Not on file   Social Drivers of Health   Financial Resource Strain: Not on file  Food Insecurity: Not on file  Transportation Needs: Not on file  Physical Activity: Not on file  Stress: Not on file  Social Connections: Not on file  Intimate Partner Violence: Not on file    FAMILY HISTORY: Mother had a history of some liver disease  ALLERGIES:  is allergic to chloroquine.  MEDICATIONS:  Current Outpatient Medications  Medication Sig Dispense Refill   albuterol  (VENTOLIN  HFA) 108 (90 Base) MCG/ACT inhaler Inhale 2 puffs into the lungs every 6 (six) hours as needed for shortness of breath and wheezing 6.7 g 0   aspirin 81 MG chewable tablet Chew by mouth daily.     Continuous Glucose Receiver (FREESTYLE LIBRE 3 READER) DEVI Use as directed 1 each 1   dapagliflozin  propanediol (FARXIGA ) 10 MG TABS tablet Take 1 tablet (10 mg total) by mouth daily. 90 tablet 3   ergocalciferol  (VITAMIN D2) 1.25 MG (50000 UT) capsule Take 1 capsule (50,000 Units total) by mouth once a week. 12 capsule 1   famotidine (PEPCID) 20 MG tablet Take 20 mg by mouth 2 (two) times daily.     loratadine (CLARITIN) 10 MG tablet Take 10 mg by mouth daily.     metFORMIN  (GLUCOPHAGE -XR) 500 MG 24 hr tablet Take 2 tablets (1,000 mg total) by mouth with food 2 (two) times daily for 90 days. 360 tablet 3   rosuvastatin  (CRESTOR ) 10 MG tablet Take 1 tablet (10 mg total) by mouth daily. 90 tablet 3   tirzepatide  (MOUNJARO ) 7.5 MG/0.5ML Pen Inject 7.5 mg into the skin once a week. 2 mL 0   colchicine  0.6 MG tablet Take 1 tablet (0.6 mg total) by mouth 2 (two) times daily for 3 weeks (Patient not taking: Reported on 04/26/2024) 42 tablet 1   Continuous Glucose Sensor (FREESTYLE LIBRE 3 PLUS SENSOR) MISC Apply to upper arm every 15 days. 6 each 3   Continuous Glucose Sensor (FREESTYLE LIBRE 3 SENSOR) MISC Change sensor every 14 days as directed 24 each 3   losartan  (COZAAR ) 25  MG tablet Take 1 tablet (25 mg total) by mouth daily. 90 tablet 3   Semaglutide , 1 MG/DOSE, (OZEMPIC , 1 MG/DOSE,) 4 MG/3ML SOPN Inject 1 mg into the skin once a week. (Patient not taking: Reported on 04/26/2024) 3 mL 1   tirzepatide  (MOUNJARO ) 2.5 MG/0.5ML Pen Inject 2.5 mg into the skin once a week. (Patient not taking: Reported on 04/26/2024) 2 mL 0   tirzepatide  (MOUNJARO ) 5 MG/0.5ML Pen Inject 5 mg into the skin once a week. (Patient not taking: Reported on 04/26/2024) 2 mL 0   No current facility-administered medications for this visit.    REVIEW OF SYSTEMS:   10 Point review of Systems was done is negative except as noted above.   PHYSICAL EXAMINATION:  .BP 128/82   Pulse 82   Temp 98.3 F (36.8 C)   Resp 20   Wt 213 lb 1.6 oz (96.7 kg)   SpO2 100%  BMI 27.36 kg/m    GENERAL:alert, in no acute distress and comfortable SKIN: no acute rashes, no significant lesions EYES: conjunctiva are pink and non-injected, sclera anicteric OROPHARYNX: MMM, no exudates, no oropharyngeal erythema or ulceration NECK: supple, no JVD LYMPH:  no palpable lymphadenopathy in the cervical, axillary or inguinal regions LUNGS: clear to auscultation b/l with normal respiratory effort HEART: regular rate & rhythm ABDOMEN:  normoactive bowel sounds , non tender, not distended. Extremity: no pedal edema PSYCH: alert & oriented x 3 with fluent speech NEURO: no focal motor/sensory deficits   LABORATORY DATA:  I have reviewed the data as listed  .    Latest Ref Rng & Units 04/26/2024   11:01 AM 03/28/2024    8:25 AM 03/01/2024   11:22 AM  CBC  WBC 4.0 - 10.5 K/uL 3.2  3.2  2.9   Hemoglobin 13.0 - 17.0 g/dL 89.5  89.0  89.8   Hematocrit 39.0 - 52.0 % 32.3  36.1  31.1   Platelets 150 - 400 K/uL 176  211  204     .    Latest Ref Rng & Units 04/26/2024   11:01 AM 03/01/2024   11:22 AM 11/30/2023    6:05 PM  CMP  Glucose 70 - 99 mg/dL 870  874  866   BUN 6 - 20 mg/dL 19  17  15    Creatinine 0.61 -  1.24 mg/dL 9.00  9.06  8.98   Sodium 135 - 145 mmol/L 134  136  132   Potassium 3.5 - 5.1 mmol/L 3.7  3.7  4.2   Chloride 98 - 111 mmol/L 104  105  99   CO2 22 - 32 mmol/L 24  26  26    Calcium  8.9 - 10.3 mg/dL 8.4  8.6  9.3   Total Protein 6.5 - 8.1 g/dL 8.4  8.6  9.1   Total Bilirubin 0.0 - 1.2 mg/dL 0.2  0.3  0.3   Alkaline Phos 38 - 126 U/L 91  107  82   AST 15 - 41 U/L 11  15  17    ALT 0 - 44 U/L 7  10  11     . Lab Results  Component Value Date   LDH 101 03/01/2024     Component     Latest Ref Rng & Units 07/09/2021  Speckled Pattern      2,4,5,29 (H)  NOTE:      Comment  LDH     98 - 192 U/L 160  Magnesium      1.7 - 2.4 mg/dL 1.9  HCV Ab     0.0 - 0.9 s/co ratio <0.1 (A)  Hepatitis B Surface Ag     NON REACTIVE NON REACTIVE  Hep B Core Total Ab     NON REACTIVE NON REACTIVE  HIV Screen 4th Generation wRfx     Non Reactive Non Reactive  EBV VCA IgM     0.0 - 35.9 U/mL <36.0  EBV VCA IgG     0.0 - 17.9 U/mL 99.1 (H)  Vitamin D , 25-Hydroxy     30 - 100 ng/mL 31.27  ANA Ab, IFA      Positive (A)  CMV IgM     0.0 - 29.9 AU/mL <30.0  CMV Ab - IgG     0.00 - 0.59 U/mL 7.00 (H)   FANA Staining Patterns Order: [id number] Status: Final result   Visible to patient: Yes (seen)   Next appt: 01/08/2022 at 09:30 AM  in Oncology ([facility])   0 Result Notes Component 3 wk ago   Speckled Pattern 2,4,5,29 High    Comment: 1:1280  ICAP nomenclature: [facility]      SURGICAL PATHOLOGY  CASE: [id number]  PATIENT: Meer Defrancesco  Surgical Pathology Report      Clinical History: left axillary and generalized adenopathy    FINAL MICROSCOPIC DIAGNOSIS:   A. LYMPH NODE, LEFT AXILLARY, EXCISION:  -  Enlarged lymph nodes with progressive transformation of germinal  centers  -  No morphologic or immunophenotypic evidence of a lymphoproliferative  process  -  See comment   COMMENT:   The excision specimen consists of five lymph nodes of varying sizes.   Overall nodal architecture is preserved.  The most notable feature is  enlarged germinal centers with irregular margins.  By  immunohistochemistry, the germinal centers are positive for CD20, CD10,  BCL6, but negative for Bcl-2 and cyclin D1.  CD23 highlights expanded  follicular dendritic meshworks.  Ki-67 highlights preserved polarization  within a large portion of the germinal centers.  EBV by in situ  hybridization is negative. CD30 highlights scattered enlarged  lymphocytes which do not express CD15, these are favored to represent  activated immunoblasts.  CD3 and CD5 highlight background T cells.  CD68  highlights histiocytes.  Flow cytometry performed on the sample (see  [id number]) identified a kappa-predominant CD10 positive B-cell  population that comprised 10% of all lymphocytes.  The significance of  this is uncertain given the absence of morphologic features.   Overall, the findings are favored to represent progressive  transformation of germinal centers.  There are no definitive features  present to suggest a lymphoproliferative process; however, given the  presence of a kappa predominant population by flow cytometry, B-cell  clonality studies and FISH t(14;18)) are pending and will be reported in  an addendum.  Dr. Tory (hematopathologist) reviewed the case and  agrees with the above diagnosis.    03/28/2024 Bone Marrow Aspirate:         04/11/2024 Cytogenetics:    04/11/2024 FISH Analysis:     RADIOGRAPHIC STUDIES: I have personally reviewed the radiological images as listed and agreed with the findings in the report. No results found.   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 No obvious symptoms no recent viral infection or febrile illness. No obvious  symptoms suggestive of autoimmune condition.  He did have some arthralgias and myalgias but these resolved with electrolyte replacement.  2) HTN 3) HLD 4) OSA on CPAP 5) Smoking 6) left adrenal gland nodular thickening  PLAN:  -discussed that his body is showing signs of a lot of inflammation. His IgG level has been persistently elevated in 3000s with mild fluctuations. Discussed that normal IgG levels would be 1600. Discussed that his elevated IgG is not monoclonal, it is not made by a plasma cell disorder, and there is no concern for obvious lymphoma.  -discussed that it is possible that his body could be reacting to different potential phenomena such as an unusual inflammatory disorder, autoimmune process, very non-specific process, or other rare phenomena.  -discussed that in rare cases, inflammation can be caused by IgG4-related disease or Castleman's disease. We discussed that  though patient does not meet typical criteria for anything specific, it is possible that he could meet subtle criteria.  -discussed option of rheumatology referral,  though we discussed that the absence of any localizable focal symptomology can make classification by rheumatologist more challenging -Rheumatoid panel normal -Discussed lab results on 04/26/2024 in detail with patient. CBC showed WBC of 3.2K, hemoglobin of 10.4, and platelets of 176K. 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 We discussed that this does not typically cause significant anemia, though there can be some microcytosis at baseline -B12 levels slightly low at 209 -discussed that Metformin  can cause B12 deficiency -iron levels low -Iron saturation 16% -ferritin is mildly higher than expected at 210 - Discussed that his  ferritin is likely overestimated due to inflammation -his bone marrow biopsy shows that patient is iron deficient -discussed that some of his anemia is related to B12 deficiency and iron deficiency to some degree as well -his 03/28/2024 bone marrow biopsy showed no clonal findings. There are no findings of lymphoma or specific abnormalities. There is some increase in plasma cells at borderline levels of 7%, which appears to be reactive. There is no clonality in his light chains.  -FLOW cytometry negative -Cytogenetics negative -FISH panel negative -03/24/2024 Cytology is negative -patient does not meet criteria for myeloma -patient had a CT chest PE scan ordered by Dr. Cherrie in March for severe chest pain. His lymph nodes were noted to be fairly stable at that time.  -he has no palpable lymph nodes based on physical exam  -01/02/2024 brain MRI showed No acute intracranial abnormality -there are no overt findings of a primary bone marrow disorder -recommend replacing his iron, B12, and some of his other B vitamins to optimize his blood counts -recommend taking iron polysaccharide at least 3 days a week. Also discussed option of liquid form if he chooses to -recommend taking vitamin B complex to better replenish his B vitamins, especially given his alpha thalassemia trait   -recommend taking sublingual B12 at least [phone] mcg daily. If he takes a larger amount of 2.5K-5K, he shall take B12 replacement two days a week.  -will follow-up on his remaining labs -discussed that copper  deficiencies can cause neutropenia and mild anemia - will review his copper  levels -will send out a broader autoimmune panel to ensure there is no obvious autoimmune process involved.  -will order additional testing to rule out castleman's disease including interleukin-6 as well as other tests -discussed option of receiving an additional opinion with [facility] -answered all of patient's questions in  detail  FOLLOW-UP: Labs today will call patient with results  The total time spent in the appointment was 30 minutes* .  All of the patient's questions were answered with apparent satisfaction. The patient knows to call the clinic with any problems, questions or concerns.   Emaline Saran MD MS AAHIVMS [facility] Hematology/Oncology Physician [facility]  .*Total Encounter Time as defined by the Centers for Medicare and Medicaid Services includes, in addition to the face-to-face time of a patient visit (documented in the note above) non-face-to-face time: obtaining and reviewing outside history, ordering and reviewing medications, tests or procedures, care coordination (communications with other health care professionals or caregivers) and documentation in the medical record.    I,Mitra Faeizi,acting as a Neurosurgeon for Emaline Saran, MD.,have documented all relevant documentation on the behalf of Emaline Saran, MD,as directed by  Emaline Saran, MD  while in the presence of Emaline Saran, MD.  .I have reviewed the above documentation for accuracy and completeness, and I agree with the above. .Salome Cozby Kishore Skylinn Vialpando MD   ADDENDUM  Component     Latest Ref Rng 04/26/2024  SSA (Ro) (ENA) Antibody, IgG     0.0 - 0.9 AI >8.0 (H)   SSB (La) (ENA) Antibody, IgG     0.0 - 0.9 AI >8.0 (H)   Ribonucleic Protein     0.0 - 0.9 AI >8.0 (H)   ENA SM Ab Ser-aCnc     0.0 - 0.9 AI >8.0 (H)   Speckled Pattern 2,4,5,29 (H)   NOTE: Comment   Copper      69 - 132 ug/dL 890   Sed Rate     0 - 16 mm/hr 40 (H)   Vitamin B12     180 - 914 pg/mL 209   ds DNA Ab     0 - 9 IU/mL 1   ANA Ab, IFA Positive !     Legend: (H) High ! Abnormal   High level ANA speckled pattern titers with +ve anti SM ab concerning for SLE. 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  Plan -refer to Dr Jeannetta -- rheumatology.  .Seraiah Nowack Kishore Woodroe Vogan  MD

## 2024-04-26 ENCOUNTER — Inpatient Hospital Stay: Attending: Hematology

## 2024-04-26 ENCOUNTER — Other Ambulatory Visit: Payer: Self-pay | Admitting: Hematology

## 2024-04-26 ENCOUNTER — Inpatient Hospital Stay (HOSPITAL_BASED_OUTPATIENT_CLINIC_OR_DEPARTMENT_OTHER): Admitting: Hematology

## 2024-04-26 ENCOUNTER — Other Ambulatory Visit (HOSPITAL_COMMUNITY): Payer: Self-pay

## 2024-04-26 VITALS — BP 128/82 | HR 82 | Temp 98.3°F | Resp 20 | Wt 213.1 lb

## 2024-04-26 DIAGNOSIS — R591 Generalized enlarged lymph nodes: Secondary | ICD-10-CM | POA: Diagnosis not present

## 2024-04-26 DIAGNOSIS — D61818 Other pancytopenia: Secondary | ICD-10-CM

## 2024-04-26 DIAGNOSIS — M329 Systemic lupus erythematosus, unspecified: Secondary | ICD-10-CM

## 2024-04-26 LAB — VITAMIN B12: Vitamin B-12: 209 pg/mL (ref 180–914)

## 2024-04-26 LAB — CBC WITH DIFFERENTIAL (CANCER CENTER ONLY)
Abs Immature Granulocytes: 0.01 K/uL (ref 0.00–0.07)
Basophils Absolute: 0 K/uL (ref 0.0–0.1)
Basophils Relative: 1 %
Eosinophils Absolute: 0.1 K/uL (ref 0.0–0.5)
Eosinophils Relative: 3 %
HCT: 32.3 % — ABNORMAL LOW (ref 39.0–52.0)
Hemoglobin: 10.4 g/dL — ABNORMAL LOW (ref 13.0–17.0)
Immature Granulocytes: 0 %
Lymphocytes Relative: 29 %
Lymphs Abs: 0.9 K/uL (ref 0.7–4.0)
MCH: 23.4 pg — ABNORMAL LOW (ref 26.0–34.0)
MCHC: 32.2 g/dL (ref 30.0–36.0)
MCV: 72.7 fL — ABNORMAL LOW (ref 80.0–100.0)
Monocytes Absolute: 0.3 K/uL (ref 0.1–1.0)
Monocytes Relative: 11 %
Neutro Abs: 1.8 K/uL (ref 1.7–7.7)
Neutrophils Relative %: 56 %
Platelet Count: 176 K/uL (ref 150–400)
RBC: 4.44 MIL/uL (ref 4.22–5.81)
RDW: 18.9 % — ABNORMAL HIGH (ref 11.5–15.5)
WBC Count: 3.2 K/uL — ABNORMAL LOW (ref 4.0–10.5)
nRBC: 0 % (ref 0.0–0.2)

## 2024-04-26 LAB — CMP (CANCER CENTER ONLY)
ALT: 7 U/L (ref 0–44)
AST: 11 U/L — ABNORMAL LOW (ref 15–41)
Albumin: 3.4 g/dL — ABNORMAL LOW (ref 3.5–5.0)
Alkaline Phosphatase: 91 U/L (ref 38–126)
Anion gap: 6 (ref 5–15)
BUN: 19 mg/dL (ref 6–20)
CO2: 24 mmol/L (ref 22–32)
Calcium: 8.4 mg/dL — ABNORMAL LOW (ref 8.9–10.3)
Chloride: 104 mmol/L (ref 98–111)
Creatinine: 0.99 mg/dL (ref 0.61–1.24)
GFR, Estimated: >60 mL/min (ref >60)
Glucose, Bld: 129 mg/dL — ABNORMAL HIGH (ref 70–99)
Potassium: 3.7 mmol/L (ref 3.5–5.1)
Sodium: 134 mmol/L — ABNORMAL LOW (ref 135–145)
Total Bilirubin: 0.2 mg/dL (ref 0.0–1.2)
Total Protein: 8.4 g/dL — ABNORMAL HIGH (ref 6.5–8.1)

## 2024-04-26 LAB — SEDIMENTATION RATE: Sed Rate: 40 mm/h — ABNORMAL HIGH (ref 0–16)

## 2024-04-26 MED ORDER — LOSARTAN POTASSIUM 25 MG PO TABS
25.0000 mg | ORAL_TABLET | Freq: Every day | ORAL | 3 refills | Status: AC
  Filled 2024-04-26: qty 90, 90d supply, fill #0
  Filled 2024-06-09 – 2024-07-23 (×2): qty 90, 90d supply, fill #1
  Filled 2024-10-15: qty 90, 90d supply, fill #2

## 2024-04-27 LAB — SJOGREN'S SYNDROME ANTIBODS(SSA + SSB)
SSA (Ro) (ENA) Antibody, IgG: >8 AI — ABNORMAL HIGH (ref 0.0–0.9)
SSB (La) (ENA) Antibody, IgG: >8 AI — ABNORMAL HIGH (ref 0.0–0.9)

## 2024-04-27 LAB — ANTINUCLEAR ANTIBODIES, IFA: ANA Ab, IFA: POSITIVE — AB

## 2024-04-27 LAB — ANTIEXTRACTABLE NUCLEAR AG
ENA SM Ab Ser-aCnc: >8 AI — ABNORMAL HIGH (ref 0.0–0.9)
Ribonucleic Protein: >8 AI — ABNORMAL HIGH (ref 0.0–0.9)

## 2024-04-27 LAB — ANTI-DNA ANTIBODY, DOUBLE-STRANDED: ds DNA Ab: 1 [IU]/mL (ref 0–9)

## 2024-04-27 LAB — FANA STAINING PATTERNS: Speckled Pattern: 24529 — ABNORMAL HIGH

## 2024-04-28 LAB — COPPER, SERUM: Copper: 109 ug/dL (ref 69–132)

## 2024-05-05 ENCOUNTER — Other Ambulatory Visit: Payer: Self-pay

## 2024-05-05 DIAGNOSIS — D61818 Other pancytopenia: Secondary | ICD-10-CM

## 2024-05-16 ENCOUNTER — Other Ambulatory Visit (HOSPITAL_COMMUNITY): Payer: Self-pay

## 2024-05-16 MED ORDER — MOUNJARO 7.5 MG/0.5ML ~~LOC~~ SOAJ
7.5000 mg | SUBCUTANEOUS | 1 refills | Status: DC
  Filled 2024-05-16: qty 2, 28d supply, fill #0
  Filled 2024-06-09: qty 2, 28d supply, fill #1

## 2024-05-24 NOTE — Telephone Encounter (Signed)
 Left a message for  patient with an appointment on 11/14 3 pm- Advised to call back if this does not work

## 2024-06-08 ENCOUNTER — Other Ambulatory Visit: Payer: Self-pay

## 2024-06-09 ENCOUNTER — Other Ambulatory Visit: Payer: Self-pay

## 2024-06-09 ENCOUNTER — Other Ambulatory Visit (HOSPITAL_COMMUNITY): Payer: Self-pay

## 2024-06-21 NOTE — Progress Notes (Addendum)
 Office Visit Note  Patient: Mark Nichols             Date of Birth: [birth year]           MRN: [id number]             PCP: Rexanne Ingle, MD Referring: Onesimo Emaline Brink, MD Visit Date: 06/30/2024 Occupation: PHYSICIAN-HOSPITALIST  Subjective:  Positive ANA, lymphadenopathy, pancytopenia  History of Present Illness: Mark Nichols is a 53 y.o. male seen for the evaluation of possible lupus.  According to the patient in October 2022 he had a CT scan of the chest with contrast which showed lymphadenopathy.  He was referred to Dr. Onesimo.  He states that the lymph node biopsy was reactive and the flow cytometry was negative.  He had been under care of Dr. Onesimo for last several years for low white cell count, low hemoglobin and low platelets.  He was also given a possible diagnosis of thalassemia trait.  He was diagnosed with diabetes in 2024 and has been under care of Dr. Faythe.  In March 2025 he presented with bilateral optic disc edema without any visual disturbances.  He had MRI of the brain and the orbits which were unremarkable per ophthalmology report.  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  He had intensive workup by neurologist which was unremarkable.  He had MRI of the brain on January 02, 2024 which was normal.  Lumbar puncture was normal pressure. In July 2025 Dr. Onesimo did extensive labs which came positive for ANA and other serology.  He was referred to me for further evaluation.  He denies any history of oral ulcers, nasal ulcers, sicca symptoms, fatigue, malar rash, photosensitivity, Raynaud's, inflammatory arthritis.  He states his hands stay cold but he has not noticed any discoloration.  He also had a CT bone marrow biopsy on March 28, 2024 which was negative per patient.  There is no family history of autoimmune disease.  He is right-handed, works as a Licensed conveyancer.  He is married and has 3 children and 3 siblings and 2 half siblings were all in good health.  He enjoys  traveling and walking.  He drinks alcohol occasionally and also smokes occasionally.    Activities of Daily Living:  Patient reports morning stiffness for 0 minutes.   Patient Denies nocturnal pain.  Difficulty dressing/grooming: Denies Difficulty climbing stairs: Denies Difficulty getting out of chair: Denies Difficulty using hands for taps, buttons, cutlery, and/or writing: Denies  Review of Systems  Constitutional:  Negative for fatigue.  HENT:  Negative for mouth sores and mouth dryness.   Eyes:  Negative for dryness.  Respiratory:  Negative for shortness of breath and wheezing.   Cardiovascular:  Negative for chest pain and palpitations.  Gastrointestinal:  Negative for blood in stool, constipation and diarrhea.  Endocrine: Negative for increased urination.  Genitourinary:  Negative for involuntary urination.  Musculoskeletal:  Negative for joint pain, gait problem, joint pain, joint swelling, myalgias, muscle weakness, morning stiffness, muscle tenderness and myalgias.  Skin:  Negative for color change, rash, hair loss and sensitivity to sunlight.  Allergic/Immunologic: Negative for susceptible to infections.  Neurological:  Negative for dizziness and headaches.  Hematological:  Negative for swollen glands.  Psychiatric/Behavioral:  Negative for depressed mood and sleep disturbance. The patient is not nervous/anxious.     PMFS History:  Patient Active Problem List   Diagnosis Date Noted   OSA (obstructive sleep apnea) 11/26/2017   Obesity 04/18/2015  Tobacco use 12/07/2011   Chest pain 08/01/2011   HTN (hypertension) 08/01/2011   Hyperlipidemia 08/01/2011   PHARYNGITIS, STREPTOCOCCAL 01/05/2007   Acute upper respiratory infection 01/05/2007    Past Medical History:  Diagnosis Date   Diabetes (HCC)    GERD (gastroesophageal reflux disease)    Hypercholesteremia    Hypertension    Pneumonia    Sleep apnea     Family History  Problem Relation Age of Onset    Coronary artery disease Mother    Stroke Father    Healthy Daughter    Healthy Son    Healthy Son    Past Surgical History:  Procedure Laterality Date   AXILLARY LYMPH NODE BIOPSY Left 07/17/2021   Procedure: LEFT AXILLARY LYMPH NODE BIOPSY;  Surgeon: Ebbie Cough, MD;  [name];  Service: General;  Laterality: Left;   Social History   Tobacco Use   Smoking status: Some Days   Smokeless tobacco: Never   Tobacco comments:    Patient smokes rarely.   Vaping Use   Vaping status: Never Used  Substance Use Topics   Alcohol use: Yes    Comment: socailly   Drug use: No   Social History   Social History Narrative   Not on file      There is no immunization history on file for this patient.   Objective: Vital Signs: BP 135/80 (BP Location: Right Arm, Patient Position: Sitting, Cuff Size: Large)   Pulse 80   Temp 97.8 F (36.6 C)   Resp 15   Ht 6' 1 (1.854 m)   Wt 210 lb 9.6 oz (95.5 kg)   BMI 27.79 kg/m    Physical Exam Vitals and nursing note reviewed.  Constitutional:      Appearance: He is well-developed.  HENT:     Head: Normocephalic and atraumatic.  Eyes:     Conjunctiva/sclera: Conjunctivae normal.     Pupils: Pupils are equal, round, and reactive to light.  Cardiovascular:     Rate and Rhythm: Normal rate and regular rhythm.     Heart sounds: Normal heart sounds.  Pulmonary:     Effort: Pulmonary effort is normal.     Breath sounds: Normal breath sounds.  Abdominal:     General: Bowel sounds are normal.     Palpations: Abdomen is soft.  Musculoskeletal:     Cervical back: Normal range of motion and neck supple.  Skin:    General: Skin is warm and dry.     Capillary Refill: Capillary refill takes less than 2 seconds.  Neurological:     Mental Status: He is alert and oriented to person, place, and time.  Psychiatric:        Behavior: Behavior normal.      Musculoskeletal Exam: Cervical, thoracic and lumbar spine were in good  range of motion.  There was no SI joint tenderness.  Shoulder joints, elbow joints, wrist joints, MCPs, PIPs and DIPs were in good range of motion with no synovitis.  No nailbed capillary changes or sclerodactyly was noted.  Hip joints and knee joints were in good range of motion without any warmth swelling or effusion.  There was no tenderness over ankles or MTPs.   CDAI Exam: CDAI Score: -- Patient Global: --; Provider Global: -- Swollen: --; Tender: -- Joint Exam 06/30/2024   No joint exam has been documented for this visit   There is currently no information documented on the homunculus. Go to the Rheumatology activity and  complete the homunculus joint exam.  Investigation: No additional findings.  Imaging: No results found.  Recent Labs: Lab Results  Component Value Date   WBC 3.2 (L) 04/26/2024   HGB 10.4 (L) 04/26/2024   PLT 176 04/26/2024   NA 134 (L) 04/26/2024   K 3.7 04/26/2024   CL 104 04/26/2024   CO2 24 04/26/2024   GLUCOSE 129 (H) 04/26/2024   BUN 19 04/26/2024   CREATININE 0.99 04/26/2024   BILITOT 0.2 04/26/2024   ALKPHOS 91 04/26/2024   AST 11 (L) 04/26/2024   ALT 7 04/26/2024   PROT 8.4 (H) 04/26/2024   ALBUMIN 3.4 (L) 04/26/2024   CALCIUM  8.4 (L) 04/26/2024   GFRAA >60 07/07/2018   April 26, 2024 sed rate 40, ANA 1: 640 NS, dsDNA negative Smith> 8.0, RNP> 8.0, SSA> 8.0, SSB> 8.0, B12 normal,  March 01, 2024 vitamin D  42.44, B12 283, iron saturation 16, TIBC 185, RF <10.0, anti-CCP 7  January 08, 2022 ANA 1: 1280  July 09, 2021 hepatitis B nonreactive, hepatitis C nonreactive, HIV nonreactive, CMV and EBV IgM negative  IMPRESSION: 1. Prevascular soft tissue nodules and subpectoral/axillary adenopathy bilaterally, findings highly worrisome for lymphoma. 2. Left adrenal nodular thickening/adenomas. No further follow-up is necessary. 3.  Aortic atherosclerosis (ICD10-I70.0).     Electronically Signed   By: Newell Eke M.D.   On: 07/08/2021  15:34  Speciality Comments: No specialty comments available.  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  He is asymptomatic.  He has had problems with papilledema and possible pericarditis in the past per patient.  Plan: Protein / creatinine ratio, urine, CK, Sedimentation rate, ANA, Anti-DNA antibody, double-stranded, C3 and C4, Anti-scleroderma antibody, Beta-2 glycoprotein antibodies, Cardiolipin antibodies, IgG, IgM, IgA, Lupus Anticoagulant Eval w/Reflex.  I discussed possible use of hydroxychloroquine to see if he has a response as regards to pancytopenia, and lymphadenopathy.  He does not have any other symptoms.  Use of sunscreen was advised.  High risk medication use - Allergy to chloroquine (patient mentions that he took chloroquine when he was 53 years old.  He states after taking it for few days he discontinued as he was having eye discomfort.) I advised him to contact his ophthalmologist at [facility] for clearance in case we need to start him on hydroxychloroquine.  Ocular toxicity associated with hydroxychloroquine was discussed.  A handout was placed in the AVS.  I will obtain following labs in anticipation to start treatment with no suppressive therapy.- Plan: Glucose 6 phosphate dehydrogenase, Thiopurine methyltransferase(tpmt)rbc, QuantiFERON-TB Gold Plus  Pancytopenia (HCC)-on chart review is been having pancytopenia since April 2023.  He has been under care of Dr. Onesimo.  Lymphadenopathy - Anterior mediastinal, bilateral subpectoral and axillary lymphadenopathy incidentally noted on CT coronary angio.  Optic nerve edema-he developed symptoms in  May 2025.  He was evaluated by ophthalmology and neurology.  Their workup was negative.  He is supposed to schedule a follow-up appointment.  There was question about optic nerve edema related to diabetes.  Primary hypertension-blood pressure was 135/80.  He is on Cozaar .  Mixed hyperlipidemia-he is on Crestor  and followed by Dr. Bensimhon.  History of diabetes mellitus-he had intentional weight loss of 80 pounds.  He is on metformin  and Mounjaro .  B12 deficiency - B12 1000 mcg sublingual daily.  Iron deficiency - Extensive workup by Dr. Onesimo including bone marrow biopsy which showed iron deficiency.  Tobacco use - occasional x 15 years  OSA (obstructive sleep apnea) - on CPAP  BMI 27.79-he has been trying weight loss.  Orders: Orders Placed This Encounter  Procedures   Protein / creatinine ratio, urine   CK   Sedimentation rate   ANA   Anti-DNA antibody, double-stranded   C3 and C4   Anti-scleroderma antibody   Beta-2 glycoprotein antibodies   Cardiolipin antibodies, IgG, IgM, IgA   Lupus Anticoagulant Eval w/Reflex   Glucose 6 phosphate dehydrogenase   Thiopurine methyltransferase(tpmt)rbc   QuantiFERON-TB Gold Plus   No orders of the defined types were placed in this encounter.    Follow-Up Instructions: Return for Systemic lupus.   Maya Nash, MD  Note - This record has been created using Animal nutritionist.  Chart creation errors have been sought, but may not always  have been located. Such creation errors do not reflect on  the standard of medical care.

## 2024-06-30 ENCOUNTER — Encounter: Payer: Self-pay | Admitting: Rheumatology

## 2024-06-30 ENCOUNTER — Ambulatory Visit: Attending: Rheumatology | Admitting: Rheumatology

## 2024-06-30 VITALS — BP 135/80 | HR 80 | Temp 97.8°F | Resp 15 | Ht 73.0 in | Wt 210.6 lb

## 2024-06-30 DIAGNOSIS — I10 Essential (primary) hypertension: Secondary | ICD-10-CM

## 2024-06-30 DIAGNOSIS — E782 Mixed hyperlipidemia: Secondary | ICD-10-CM | POA: Diagnosis not present

## 2024-06-30 DIAGNOSIS — Z79899 Other long term (current) drug therapy: Secondary | ICD-10-CM

## 2024-06-30 DIAGNOSIS — E538 Deficiency of other specified B group vitamins: Secondary | ICD-10-CM

## 2024-06-30 DIAGNOSIS — E611 Iron deficiency: Secondary | ICD-10-CM | POA: Diagnosis not present

## 2024-06-30 DIAGNOSIS — Z8639 Personal history of other endocrine, nutritional and metabolic disease: Secondary | ICD-10-CM | POA: Diagnosis not present

## 2024-06-30 DIAGNOSIS — R591 Generalized enlarged lymph nodes: Secondary | ICD-10-CM | POA: Diagnosis not present

## 2024-06-30 DIAGNOSIS — M3219 Other organ or system involvement in systemic lupus erythematosus: Secondary | ICD-10-CM

## 2024-06-30 DIAGNOSIS — H4710 Unspecified papilledema: Secondary | ICD-10-CM

## 2024-06-30 DIAGNOSIS — D61818 Other pancytopenia: Secondary | ICD-10-CM | POA: Diagnosis not present

## 2024-06-30 DIAGNOSIS — Z6827 Body mass index (BMI) 27.0-27.9, adult: Secondary | ICD-10-CM

## 2024-06-30 DIAGNOSIS — G4733 Obstructive sleep apnea (adult) (pediatric): Secondary | ICD-10-CM

## 2024-06-30 DIAGNOSIS — Z720 Tobacco use: Secondary | ICD-10-CM

## 2024-06-30 NOTE — Patient Instructions (Addendum)
Hydroxychloroquine Tablets What is this medication? HYDROXYCHLOROQUINE (hye drox ee KLOR oh kwin) treats autoimmune conditions, such as rheumatoid arthritis and lupus. It works by slowing down an overactive immune system. It may also be used to prevent and treat malaria. It works by killing the parasite that causes malaria. It belongs to a group of medications called DMARDs. This medicine may be used for other purposes; ask your health care provider or pharmacist if you have questions. COMMON BRAND NAME(S): Plaquenil, Quineprox, SOVUNA What should I tell my care team before I take this medication? They need to know if you have any of these conditions: Diabetes Eye disease, vision problems Frequently drink alcohol G6PD deficiency Heart disease Irregular heartbeat or rhythm Kidney disease Liver disease Porphyria Psoriasis An unusual or allergic reaction to hydroxychloroquine, other medications, foods, dyes, or preservatives Pregnant or trying to get pregnant Breastfeeding How should I use this medication? Take this medication by mouth with water. Take it as directed on the prescription label. Do not cut, crush, or chew this medication. Swallow the tablets whole. Take it with food. Do not take it more than directed. Take all of this medication unless your care team tells you to stop it early. Keep taking it even if you think you are better. Take products with antacids in them at a different time of day than this medication. Take this medication 4 hours before or 4 hours after antacids. Talk to your care team if you have questions. Talk to your care team about the use of this medication in children. While this medication may be prescribed for selected conditions, precautions do apply. Overdosage: If you think you have taken too much of this medicine contact a poison control center or emergency room at once. NOTE: This medicine is only for you. Do not share this medicine with others. What if I  miss a dose? If you miss a dose, take it as soon as you can. If it is almost time for your next dose, take only that dose. Do not take double or extra doses. What may interact with this medication? Do not take this medication with any of the following: Cisapride Dronedarone Pimozide Thioridazine This medication may also interact with the following: Ampicillin Antacids Cimetidine Cyclosporine Digoxin Kaolin Medications for diabetes, such as insulin, glipizide, glyburide Medications for seizures, such as carbamazepine, phenobarbital, phenytoin Mefloquine Methotrexate Other medications that cause heart rhythm changes Praziquantel This list may not describe all possible interactions. Give your health care provider a list of all the medicines, herbs, non-prescription drugs, or dietary supplements you use. Also tell them if you smoke, drink alcohol, or use illegal drugs. Some items may interact with your medicine. What should I watch for while using this medication? Visit your care team for regular checks on your progress. Tell your care team if your symptoms do not start to get better or if they get worse. You may need blood work done while you are taking this medication. If you take other medications that can affect heart rhythm, you may need more testing. Talk to your care team if you have questions. Your vision may be tested before and during use of this medication. Tell your care team right away if you have any change in your eyesight. This medication may cause serious skin reactions. They can happen weeks to months after starting the medication. Contact your care team right away if you notice fevers or flu-like symptoms with a rash. The rash may be red or purple and then  turn into blisters or peeling of the skin. Or, you might notice a red rash with swelling of the face, lips or lymph nodes in your neck or under your arms. If you or your family notice any changes in your behavior, such as  new or worsening depression, thoughts of harming yourself, anxiety, or other unusual or disturbing thoughts, or memory loss, call your care team right away. What side effects may I notice from receiving this medication? Side effects that you should report to your care team as soon as possible: Allergic reactions--skin rash, itching, hives, swelling of the face, lips, tongue, or throat Aplastic anemia--unusual weakness or fatigue, dizziness, headache, trouble breathing, increased bleeding or bruising Change in vision Heart rhythm changes--fast or irregular heartbeat, dizziness, feeling faint or lightheaded, chest pain, trouble breathing Infection--fever, chills, cough, or sore throat Low blood sugar (hypoglycemia)--tremors or shaking, anxiety, sweating, cold or clammy skin, confusion, dizziness, rapid heartbeat Muscle injury--unusual weakness or fatigue, muscle pain, dark yellow or brown urine, decrease in amount of urine Pain, tingling, or numbness in the hands or feet Rash, fever, and swollen lymph nodes Redness, blistering, peeling, or loosening of the skin, including inside the mouth Thoughts of suicide or self-harm, worsening mood, or feelings of depression Unusual bruising or bleeding Side effects that usually do not require medical attention (report to your care team if they continue or are bothersome): Diarrhea Headache Nausea Stomach pain Vomiting This list may not describe all possible side effects. Call your doctor for medical advice about side effects. You may report side effects to FDA at 1-800-FDA-1088. Where should I keep my medication? Keep out of the reach of children and pets. Store at room temperature up to 30 degrees C (86 degrees F). Protect from light. Get rid of any unused medication after the expiration date. To get rid of medications that are no longer needed or have expired: Take the medication to a medication take-back program. Check with your pharmacy or law  enforcement to find a location. If you cannot return the medication, check the label or package insert to see if the medication should be thrown out in the garbage or flushed down the toilet. If you are not sure, ask your care team. If it is safe to put it in the trash, empty the medication out of the container. Mix the medication with cat litter, dirt, coffee grounds, or other unwanted substance. Seal the mixture in a bag or container. Put it in the trash. NOTE: This sheet is a summary. It may not cover all possible information. If you have questions about this medicine, talk to your doctor, pharmacist, or health care provider.  2024 Elsevier/Gold Standard (2022-03-24 00:00:00)  Vaccines You are taking a medication(s) that can suppress your immune system.  The following immunizations are recommended: Flu annually Covid-19  RSV Td/Tdap (tetanus, diphtheria, pertussis) every 10 years Pneumonia (Prevnar 15 then Pneumovax 23 at least 1 year apart.  Alternatively, can take Prevnar 20 without needing additional dose) Shingrix: 2 doses from 4 weeks to 6 months apart  Please check with your PCP to make sure you are up to date.

## 2024-07-05 ENCOUNTER — Encounter: Payer: Self-pay | Admitting: Rheumatology

## 2024-07-05 DIAGNOSIS — M3219 Other organ or system involvement in systemic lupus erythematosus: Secondary | ICD-10-CM | POA: Diagnosis not present

## 2024-07-05 DIAGNOSIS — Z79899 Other long term (current) drug therapy: Secondary | ICD-10-CM | POA: Diagnosis not present

## 2024-07-06 ENCOUNTER — Ambulatory Visit: Payer: Self-pay | Admitting: Rheumatology

## 2024-07-06 DIAGNOSIS — M3219 Other organ or system involvement in systemic lupus erythematosus: Secondary | ICD-10-CM

## 2024-07-06 NOTE — Progress Notes (Signed)
 Urine protein creatinine ratio was elevated at 694.  Sed rate is mildly elevated, complements are low, CK normal.  Rest of the labs are pending.  Please notify patient and refer him to nephrology for proteinuria and lupus.

## 2024-07-07 ENCOUNTER — Other Ambulatory Visit (HOSPITAL_COMMUNITY): Payer: Self-pay

## 2024-07-08 ENCOUNTER — Other Ambulatory Visit (HOSPITAL_COMMUNITY): Payer: Self-pay

## 2024-07-08 MED ORDER — MOUNJARO 7.5 MG/0.5ML ~~LOC~~ SOAJ
7.5000 mg | SUBCUTANEOUS | 1 refills | Status: DC
  Filled 2024-07-08: qty 2, 28d supply, fill #0
  Filled 2024-08-06: qty 2, 28d supply, fill #1

## 2024-07-08 NOTE — Progress Notes (Signed)
 Office Visit Note  Patient: Mark Nichols             Date of Birth: January 18, 1971           MRN: [id number]             PCP: Rexanne Ingle, MD Referring: Rexanne Ingle, MD Visit Date: 07/15/2024 Occupation: PHYSICIAN-HOSPITALIST  Subjective:  Treatment of lupus  History of Present Illness: Mark Nichols is a 53 y.o. male with systemic lupus erythematosus, pancytopenia, lymphadenopathy and optic nerve edema.  He returns today after his initial visit on June 30, 2024.  He was evaluated for pancytopenia, lymphadenopathy and positive serology for lupus.  He denies history of fatigue, oral ulcers, nasal ulcers, sicca symptoms, malar rash, photosensitivity, Raynaud's phenomenon, inflammatory arthritis.  He states that he remains completely asymptomatic.    Activities of Daily Living:  Patient reports morning stiffness for 0 minute.   Patient Denies nocturnal pain.  Difficulty dressing/grooming: Denies Difficulty climbing stairs: Denies Difficulty getting out of chair: Denies Difficulty using hands for taps, buttons, cutlery, and/or writing: Denies  Review of Systems  Constitutional:  Negative for fatigue.  HENT:  Negative for mouth sores and mouth dryness.   Eyes:  Negative for dryness.  Respiratory:  Negative for shortness of breath.   Cardiovascular:  Negative for chest pain and palpitations.  Gastrointestinal:  Negative for blood in stool, constipation and diarrhea.  Endocrine: Negative for increased urination.  Genitourinary:  Negative for involuntary urination.  Musculoskeletal:  Negative for joint pain, gait problem, joint pain, joint swelling, myalgias, muscle weakness, morning stiffness, muscle tenderness and myalgias.  Skin:  Negative for color change, rash, hair loss and sensitivity to sunlight.  Allergic/Immunologic: Negative for susceptible to infections.  Neurological:  Negative for dizziness and headaches.  Hematological:  Negative for swollen glands.   Psychiatric/Behavioral:  Negative for depressed mood and sleep disturbance. The patient is not nervous/anxious.     PMFS History:  Patient Active Problem List   Diagnosis Date Noted   OSA (obstructive sleep apnea) 11/26/2017   Obesity 04/18/2015   Tobacco use 12/07/2011   Chest pain 08/01/2011   HTN (hypertension) 08/01/2011   Hyperlipidemia 08/01/2011   PHARYNGITIS, STREPTOCOCCAL 01/05/2007   Acute upper respiratory infection 01/05/2007    Past Medical History:  Diagnosis Date   Diabetes (HCC)    GERD (gastroesophageal reflux disease)    Hypercholesteremia    Hypertension    Pneumonia    Sleep apnea     Family History  Problem Relation Age of Onset   Coronary artery disease Mother    Stroke Father    Healthy Daughter    Healthy Son    Healthy Son    Past Surgical History:  Procedure Laterality Date   AXILLARY LYMPH NODE BIOPSY Left 07/17/2021   Procedure: LEFT AXILLARY LYMPH NODE BIOPSY;  Surgeon: Ebbie Cough, MD;  [name];  Service: General;  Laterality: Left;   Social History   Tobacco Use   Smoking status: Some Days    Passive exposure: Never   Smokeless tobacco: Never   Tobacco comments:    Patient smokes rarely.   Vaping Use   Vaping status: Never Used  Substance Use Topics   Alcohol use: Yes    Comment: socailly   Drug use: No   Social History   Social History Narrative   Not on file      There is no immunization history on file for this patient.   Objective: Vital Signs:  BP (!) 148/78   Pulse 85   Temp 98.6 F (37 C)   Resp 14   Ht 6' 2 (1.88 m)   Wt 210 lb (95.3 kg)   BMI 26.96 kg/m    Physical Exam Vitals and nursing note reviewed.  Constitutional:      Appearance: He is well-developed.  HENT:     Head: Normocephalic and atraumatic.  Eyes:     Conjunctiva/sclera: Conjunctivae normal.     Pupils: Pupils are equal, round, and reactive to light.  Cardiovascular:     Rate and Rhythm: Normal rate and regular  rhythm.     Heart sounds: Normal heart sounds.  Pulmonary:     Effort: Pulmonary effort is normal.     Breath sounds: Normal breath sounds.  Abdominal:     General: Bowel sounds are normal.     Palpations: Abdomen is soft.  Musculoskeletal:     Cervical back: Normal range of motion and neck supple.  Skin:    General: Skin is warm and dry.     Capillary Refill: Capillary refill takes less than 2 seconds.  Neurological:     Mental Status: He is alert and oriented to person, place, and time.  Psychiatric:        Behavior: Behavior normal.      Musculoskeletal Exam:Cervical, thoracic and lumbar spine were in good range of motion.  There was no SI joint tenderness.  Shoulder joints, elbow joints, wrist joints, MCPs, PIPs and DIPs were in good range of motion with no synovitis.  Hip joints and knee joints were in good range of motion without any warmth swelling or effusion.  There was no tenderness over ankles or MTPs.   CDAI Exam: CDAI Score: -- Patient Global: --; Provider Global: -- Swollen: --; Tender: -- Joint Exam 07/15/2024   No joint exam has been documented for this visit   There is currently no information documented on the homunculus. Go to the Rheumatology activity and complete the homunculus joint exam.  Investigation: No additional findings.  Imaging: No results found.  Recent Labs: Lab Results  Component Value Date   WBC 3.2 (L) 04/26/2024   HGB 10.4 (L) 04/26/2024   PLT 176 04/26/2024   NA 134 (L) 04/26/2024   K 3.7 04/26/2024   CL 104 04/26/2024   CO2 24 04/26/2024   GLUCOSE 129 (H) 04/26/2024   BUN 19 04/26/2024   CREATININE 0.99 04/26/2024   BILITOT 0.2 04/26/2024   ALKPHOS 91 04/26/2024   AST 11 (L) 04/26/2024   ALT 7 04/26/2024   PROT 8.4 (H) 04/26/2024   ALBUMIN 3.4 (L) 04/26/2024   CALCIUM  8.4 (L) 04/26/2024   GFRAA >60 07/07/2018   QFTBGOLDPLUS NEGATIVE 07/05/2024   July 05, 2024 TB Gold negative, urine protein creatinine ratio 684, ANA  1: 1280 NS, 1: 160 cytoplasmic, dsDNA negative, SCL 70 negative, beta-2 GP 1 negative, anticardiolipin negative, sed rate 33, C3 70, C4 normal, CK 86  April 26, 2024 sed rate 40, ANA 1: 640 NS, dsDNA negative Smith> 8.0, RNP> 8.0, SSA> 8.0, SSB> 8.0, B12 normal,   March 01, 2024 vitamin D  42.44, B12 283, iron saturation 16, TIBC 185, RF <10.0, anti-CCP 7   January 08, 2022 ANA 1: 1280   July 09, 2021 hepatitis B nonreactive, hepatitis C nonreactive, HIV nonreactive, CMV and EBV IgM negative Speciality Comments: No specialty comments available.  Procedures:  No procedures performed Allergies: Chloroquine   Assessment / Plan:  Visit Diagnoses: Other systemic lupus erythematosus with other organ involvement (HCC) - Positive ANA, positive Smith, positive RNP, positive SSA, positive SSB, pancytopenia, elevated sed rate, lymphadenopathy: Patient denies any history of fatigue, oral ulcers, nasal ulcers, sicca symptoms, malar rash, photosensitivity, Raynaud's or inflammatory arthritis.  No synovitis was noted on the examination.  Labs from July 05, 2024 were reviewed.  Sed rate was mildly elevated and C3 was low at 70.  Urine protein creatinine ratio was 694.  His appointment with the nephrologist is pending.  I did detailed discussion with Dr. Sebastian regarding his positive serology and lymphadenopathy.  We discussed that starting on hydroxychloroquine.  Indications side effects contraindications of hydroxychloroquine were discussed at length.  He got an approval from his ophthalmologist to start on hydroxychloroquine.  The history of possible chloroquine toxicity at age 70 is questionable.  He took chloroquine only for 7 days at the time.  After informed consent was obtained and side effects a prescription for hydroxychloroquine 200 mg p.o. twice daily was sent.  He was advised to contact us  if he develops any side effects.  Patient was counseled on the purpose, proper use, and adverse effects of  hydroxychloroquine including nausea/diarrhea, skin rash, headaches, and sun sensitivity.  Advised patient to wear sunscreen once starting hydroxychloroquine to reduce risk of rash associated with sun sensitivity.  Discussed importance of annual eye exams while on hydroxychloroquine to monitor to ocular toxicity and discussed importance of frequent laboratory monitoring.  Provided patient with eye exam form for baseline ophthalmologic exam.  Provided patient with educational materials on hydroxychloroquine and answered all questions.  Patient consented to hydroxychloroquine. Will upload consent in the media tab.    Reviewed risk for QTC prolongation when used in combination with other QTc prolonging agents (including but not limited to antiarrhythmics, macrolide antibiotics, flouroquinolone antibiotics, haloperidol, quetiapine, olanzapine, risperidone, droperidol, ziprasidone, amitriptyline, citalopram, ondansetron , migraine triptans, and methadone).  Patient reports that he has had cardiology workup in the past and his EKG and QT interval has been normal.  High risk medication use -he will get labs in a month, and then every month for 3 months.  He will get baseline eye examination by his ophthalmologist and then annual eye examination to monitor for ocular toxicity.  Information reimmunization was placed in the AVS.  Pancytopenia (HCC) - Since April 2023.  He has been followed by Dr. Onesimo.  We will see response to hydroxychloroquine use.  Lymphadenopathy - Anterior mediastinal, bilateral subpectoral and axillary lymphadenopathy incidentally noted on CT coronary angio.  Other proteinuria - He was referred to nephrology.  Urine protein creatinine ratio 684.  He has an appointment coming up with nephrology in mid November.  Primary hypertension -blood pressure was elevated today at 148/78.  Repeat blood pressure was 153/83.  He is on Cozaar .  He will monitor blood pressure closely at home.  Mixed  hyperlipidemia - On Crestor  by Dr. Bensimhon.  Increased risk of heart disease with autoimmune disease was discussed.  Dietary modifications and exercise was emphasized.  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  He was evaluated by neurology and ophthalmology.  Iron deficiency - Bone marrow biopsy done by Dr. Onesimo positive for iron deficiency.  B12 deficiency - He takes B12 1000 mcg sublingual daily.  Tobacco use - For the last 15 years.  Smoking cessation was strongly  advised.  He plans to quit smoking.  OSA (obstructive sleep apnea) - On CPAP.  BMI 27.0-27.9,adult  Orders: No orders of the defined types were placed in this encounter.  No orders of the defined types were placed in this encounter.    Follow-Up Instructions: Return for Systemic lupus.   Maya Nash, MD  Note - This record has been created using Animal nutritionist.  Chart creation errors have been sought, but may not always  have been located. Such creation errors do not reflect on  the standard of medical care.

## 2024-07-10 LAB — ANTI-NUCLEAR AB-TITER (ANA TITER)
ANA TITER: 1:160 {titer} — ABNORMAL HIGH
ANA Titer 1: 1:1280 {titer} — ABNORMAL HIGH

## 2024-07-10 LAB — THIOPURINE METHYLTRANSFERASE (TPMT), RBC: Thiopurine Methyltransferase, RBC: 19 nmol/h/mL

## 2024-07-10 LAB — LUPUS ANTICOAGULANT EVAL W/ REFLEX
PTT-LA Screen: 35 s (ref <=40)
dRVVT: 27 s (ref <=45)

## 2024-07-10 LAB — GLUCOSE 6 PHOSPHATE DEHYDROGENASE: G-6PDH: 18.7 U/g{Hb} (ref 7.0–20.5)

## 2024-07-10 LAB — CK: Total CK: 86 U/L (ref 23–325)

## 2024-07-10 LAB — BETA-2 GLYCOPROTEIN ANTIBODIES
Beta-2 Glyco 1 IgA: <2 U/mL (ref <20.0)
Beta-2 Glyco 1 IgM: <2 U/mL (ref <20.0)
Beta-2 Glyco I IgG: <2 U/mL (ref <20.0)

## 2024-07-10 LAB — QUANTIFERON-TB GOLD PLUS
Mitogen-NIL: 5.68 [IU]/mL
NIL: 0.01 [IU]/mL
QuantiFERON-TB Gold Plus: NEGATIVE
TB1-NIL: 0 [IU]/mL
TB2-NIL: 0 [IU]/mL

## 2024-07-10 LAB — ANTI-SCLERODERMA ANTIBODY: Scleroderma (Scl-70) (ENA) Antibody, IgG: <1 AI

## 2024-07-10 LAB — CARDIOLIPIN ANTIBODIES, IGG, IGM, IGA
Anticardiolipin IgA: <2 [APL'U]/mL (ref <20.0)
Anticardiolipin IgG: <2 [GPL'U]/mL (ref <20.0)
Anticardiolipin IgM: <2 [MPL'U]/mL (ref <20.0)

## 2024-07-10 LAB — C3 AND C4
C3 Complement: 70 mg/dL — ABNORMAL LOW (ref 82–185)
C4 Complement: 18 mg/dL (ref 15–53)

## 2024-07-10 LAB — PROTEIN / CREATININE RATIO, URINE
Creatinine, Urine: 98 mg/dL (ref 20–320)
Protein/Creat Ratio: 684 mg/g{creat} — ABNORMAL HIGH (ref 25–148)
Protein/Creatinine Ratio: 0.684 mg/mg{creat} — ABNORMAL HIGH (ref 0.025–0.148)
Total Protein, Urine: 67 mg/dL — ABNORMAL HIGH (ref 5–25)

## 2024-07-10 LAB — SEDIMENTATION RATE: Sed Rate: 33 mm/h — ABNORMAL HIGH (ref 0–20)

## 2024-07-10 LAB — ANA: Anti Nuclear Antibody (ANA): POSITIVE — AB

## 2024-07-10 LAB — ANTI-DNA ANTIBODY, DOUBLE-STRANDED: ds DNA Ab: 1 [IU]/mL

## 2024-07-15 ENCOUNTER — Encounter: Payer: Self-pay | Admitting: Rheumatology

## 2024-07-15 ENCOUNTER — Ambulatory Visit: Attending: Rheumatology | Admitting: Rheumatology

## 2024-07-15 ENCOUNTER — Other Ambulatory Visit (HOSPITAL_COMMUNITY): Payer: Self-pay

## 2024-07-15 VITALS — BP 153/83 | HR 85 | Temp 98.6°F | Resp 14 | Ht 74.0 in | Wt 210.0 lb

## 2024-07-15 DIAGNOSIS — Z720 Tobacco use: Secondary | ICD-10-CM

## 2024-07-15 DIAGNOSIS — Z6827 Body mass index (BMI) 27.0-27.9, adult: Secondary | ICD-10-CM

## 2024-07-15 DIAGNOSIS — E611 Iron deficiency: Secondary | ICD-10-CM

## 2024-07-15 DIAGNOSIS — H4710 Unspecified papilledema: Secondary | ICD-10-CM | POA: Diagnosis not present

## 2024-07-15 DIAGNOSIS — I10 Essential (primary) hypertension: Secondary | ICD-10-CM

## 2024-07-15 DIAGNOSIS — M3219 Other organ or system involvement in systemic lupus erythematosus: Secondary | ICD-10-CM

## 2024-07-15 DIAGNOSIS — R808 Other proteinuria: Secondary | ICD-10-CM

## 2024-07-15 DIAGNOSIS — E538 Deficiency of other specified B group vitamins: Secondary | ICD-10-CM

## 2024-07-15 DIAGNOSIS — E782 Mixed hyperlipidemia: Secondary | ICD-10-CM

## 2024-07-15 DIAGNOSIS — D61818 Other pancytopenia: Secondary | ICD-10-CM | POA: Diagnosis not present

## 2024-07-15 DIAGNOSIS — Z79899 Other long term (current) drug therapy: Secondary | ICD-10-CM | POA: Diagnosis not present

## 2024-07-15 DIAGNOSIS — R591 Generalized enlarged lymph nodes: Secondary | ICD-10-CM | POA: Diagnosis not present

## 2024-07-15 DIAGNOSIS — Z8639 Personal history of other endocrine, nutritional and metabolic disease: Secondary | ICD-10-CM

## 2024-07-15 DIAGNOSIS — G4733 Obstructive sleep apnea (adult) (pediatric): Secondary | ICD-10-CM

## 2024-07-15 MED ORDER — HYDROXYCHLOROQUINE SULFATE 200 MG PO TABS
200.0000 mg | ORAL_TABLET | Freq: Two times a day (BID) | ORAL | 2 refills | Status: DC
  Filled 2024-07-15: qty 60, 30d supply, fill #0
  Filled 2024-08-12: qty 60, 30d supply, fill #1
  Filled 2024-09-16: qty 60, 30d supply, fill #2

## 2024-07-15 NOTE — Patient Instructions (Signed)
 Hydroxychloroquine Tablets What is this medication? HYDROXYCHLOROQUINE (hye drox ee KLOR oh kwin) treats autoimmune conditions, such as rheumatoid arthritis and lupus. It works by slowing down an overactive immune system. It may also be used to prevent and treat malaria. It works by killing the parasite that causes malaria. It belongs to a group of medications called DMARDs. This medicine may be used for other purposes; ask your health care provider or pharmacist if you have questions. COMMON BRAND NAME(S): Plaquenil, Quineprox, SOVUNA What should I tell my care team before I take this medication? They need to know if you have any of these conditions: Diabetes Eye disease, vision problems Frequently drink alcohol G6PD deficiency Heart disease Irregular heartbeat or rhythm Kidney disease Liver disease Porphyria Psoriasis An unusual or allergic reaction to hydroxychloroquine, other medications, foods, dyes, or preservatives Pregnant or trying to get pregnant Breastfeeding How should I use this medication? Take this medication by mouth with water. Take it as directed on the prescription label. Do not cut, crush, or chew this medication. Swallow the tablets whole. Take it with food. Do not take it more than directed. Take all of this medication unless your care team tells you to stop it early. Keep taking it even if you think you are better. Take products with antacids in them at a different time of day than this medication. Take this medication 4 hours before or 4 hours after antacids. Talk to your care team if you have questions. Talk to your care team about the use of this medication in children. While this medication may be prescribed for selected conditions, precautions do apply. Overdosage: If you think you have taken too much of this medicine contact a poison control center or emergency room at once. NOTE: This medicine is only for you. Do not share this medicine with others. What if I  miss a dose? If you miss a dose, take it as soon as you can. If it is almost time for your next dose, take only that dose. Do not take double or extra doses. What may interact with this medication? Do not take this medication with any of the following: Cisapride Dronedarone Pimozide Thioridazine This medication may also interact with the following: Ampicillin Antacids Cimetidine Cyclosporine Digoxin Kaolin Medications for diabetes, such as insulin, glipizide, glyburide Medications for seizures, such as carbamazepine, phenobarbital, phenytoin Mefloquine Methotrexate Other medications that cause heart rhythm changes Praziquantel This list may not describe all possible interactions. Give your health care provider a list of all the medicines, herbs, non-prescription drugs, or dietary supplements you use. Also tell them if you smoke, drink alcohol, or use illegal drugs. Some items may interact with your medicine. What should I watch for while using this medication? Visit your care team for regular checks on your progress. Tell your care team if your symptoms do not start to get better or if they get worse. You may need blood work done while you are taking this medication. If you take other medications that can affect heart rhythm, you may need more testing. Talk to your care team if you have questions. Your vision may be tested before and during use of this medication. Tell your care team right away if you have any change in your eyesight. This medication may cause serious skin reactions. They can happen weeks to months after starting the medication. Contact your care team right away if you notice fevers or flu-like symptoms with a rash. The rash may be red or purple and then  turn into blisters or peeling of the skin. Or, you might notice a red rash with swelling of the face, lips or lymph nodes in your neck or under your arms. If you or your family notice any changes in your behavior, such as  new or worsening depression, thoughts of harming yourself, anxiety, or other unusual or disturbing thoughts, or memory loss, call your care team right away. What side effects may I notice from receiving this medication? Side effects that you should report to your care team as soon as possible: Allergic reactions--skin rash, itching, hives, swelling of the face, lips, tongue, or throat Aplastic anemia--unusual weakness or fatigue, dizziness, headache, trouble breathing, increased bleeding or bruising Change in vision Heart rhythm changes--fast or irregular heartbeat, dizziness, feeling faint or lightheaded, chest pain, trouble breathing Infection--fever, chills, cough, or sore throat Low blood sugar (hypoglycemia)--tremors or shaking, anxiety, sweating, cold or clammy skin, confusion, dizziness, rapid heartbeat Muscle injury--unusual weakness or fatigue, muscle pain, dark yellow or brown urine, decrease in amount of urine Pain, tingling, or numbness in the hands or feet Rash, fever, and swollen lymph nodes Redness, blistering, peeling, or loosening of the skin, including inside the mouth Thoughts of suicide or self-harm, worsening mood, or feelings of depression Unusual bruising or bleeding Side effects that usually do not require medical attention (report to your care team if they continue or are bothersome): Diarrhea Headache Nausea Stomach pain Vomiting This list may not describe all possible side effects. Call your doctor for medical advice about side effects. You may report side effects to FDA at 1-800-FDA-1088. Where should I keep my medication? Keep out of the reach of children and pets. Store at room temperature up to 30 degrees C (86 degrees F). Protect from light. Get rid of any unused medication after the expiration date. To get rid of medications that are no longer needed or have expired: Take the medication to a medication take-back program. Check with your pharmacy or law  enforcement to find a location. If you cannot return the medication, check the label or package insert to see if the medication should be thrown out in the garbage or flushed down the toilet. If you are not sure, ask your care team. If it is safe to put it in the trash, empty the medication out of the container. Mix the medication with cat litter, dirt, coffee grounds, or other unwanted substance. Seal the mixture in a bag or container. Put it in the trash. NOTE: This sheet is a summary. It may not cover all possible information. If you have questions about this medicine, talk to your doctor, pharmacist, or health care provider.  2024 Elsevier/Gold Standard (2022-03-24 00:00:00)  Standing Labs We placed an order today for your standing lab work.   Please have your standing labs drawn in 1 month and then every 3 months after starting Plaquenil  Please have your labs drawn 2 weeks prior to your appointment so that the provider can discuss your lab results at your appointment, if possible.  Please note that you may see your imaging and lab results in MyChart before we have reviewed them. We will contact you once all results are reviewed. Please allow our office up to 72 hours to thoroughly review all of the results before contacting the office for clarification of your results.  WALK-IN LAB HOURS  Monday through Thursday from 8:00 am -12:30 pm and 1:00 pm-4:30 pm and Friday from 8:00 am-12:00 pm.  Patients with office visits requiring labs  will be seen before walk-in labs.  You may encounter longer than normal wait times. Please allow additional time. Wait times may be shorter on  Monday and Thursday afternoons.  We do not book appointments for walk-in labs. We appreciate your patience and understanding with our staff.   Labs are drawn by [facility]. Please bring your co-pay at the time of your lab draw.  You may receive a bill from [facility] for your lab work.  Please note if you are on  Hydroxychloroquine and and an order has been placed for a Hydroxychloroquine level,  you will need to have it drawn 4 hours or more after your last dose.  If you wish to have your labs drawn at another location, please call the office 24 hours in advance so we can fax the orders.  The office is located at [address], [place], [place] [address]   If you have any questions regarding directions or hours of operation,  please call [phone].   As a reminder, please drink plenty of water prior to coming for your lab work. Thanks!   Vaccines You are taking a medication(s) that can suppress your immune system.  The following immunizations are recommended: Flu annually ( high dose) Covid-19  RSV Td/Tdap (tetanus, diphtheria, pertussis) every 10 years Pneumonia (Prevnar 15 then Pneumovax 23 at least 1 year apart.  Alternatively, can take Prevnar 20 without needing additional dose) Shingrix: 2 doses from 4 weeks to 6 months apart  Please check with your PCP to make sure you are up to date.

## 2024-08-12 ENCOUNTER — Other Ambulatory Visit (HOSPITAL_COMMUNITY): Payer: Self-pay

## 2024-08-12 DIAGNOSIS — Z23 Encounter for immunization: Secondary | ICD-10-CM | POA: Diagnosis not present

## 2024-08-12 DIAGNOSIS — M329 Systemic lupus erythematosus, unspecified: Secondary | ICD-10-CM | POA: Diagnosis not present

## 2024-08-12 DIAGNOSIS — E119 Type 2 diabetes mellitus without complications: Secondary | ICD-10-CM | POA: Diagnosis not present

## 2024-08-12 DIAGNOSIS — Z833 Family history of diabetes mellitus: Secondary | ICD-10-CM | POA: Diagnosis not present

## 2024-08-12 DIAGNOSIS — E279 Disorder of adrenal gland, unspecified: Secondary | ICD-10-CM | POA: Diagnosis not present

## 2024-08-12 DIAGNOSIS — I10 Essential (primary) hypertension: Secondary | ICD-10-CM | POA: Diagnosis not present

## 2024-08-12 DIAGNOSIS — D509 Iron deficiency anemia, unspecified: Secondary | ICD-10-CM | POA: Diagnosis not present

## 2024-08-12 MED ORDER — MOUNJARO 7.5 MG/0.5ML ~~LOC~~ SOAJ
7.5000 mg | SUBCUTANEOUS | 4 refills | Status: AC
  Filled 2024-08-31: qty 6, 84d supply, fill #0

## 2024-08-15 DIAGNOSIS — M3214 Glomerular disease in systemic lupus erythematosus: Secondary | ICD-10-CM | POA: Diagnosis not present

## 2024-08-15 DIAGNOSIS — M3219 Other organ or system involvement in systemic lupus erythematosus: Secondary | ICD-10-CM | POA: Diagnosis not present

## 2024-08-15 DIAGNOSIS — R809 Proteinuria, unspecified: Secondary | ICD-10-CM | POA: Diagnosis not present

## 2024-08-15 DIAGNOSIS — I129 Hypertensive chronic kidney disease with stage 1 through stage 4 chronic kidney disease, or unspecified chronic kidney disease: Secondary | ICD-10-CM | POA: Diagnosis not present

## 2024-08-31 ENCOUNTER — Other Ambulatory Visit (HOSPITAL_COMMUNITY): Payer: Self-pay

## 2024-09-07 ENCOUNTER — Other Ambulatory Visit (HOSPITAL_COMMUNITY): Payer: Self-pay

## 2024-09-08 ENCOUNTER — Other Ambulatory Visit: Payer: Self-pay

## 2024-09-08 ENCOUNTER — Other Ambulatory Visit (HOSPITAL_COMMUNITY): Payer: Self-pay

## 2024-09-08 DIAGNOSIS — E663 Overweight: Secondary | ICD-10-CM | POA: Diagnosis not present

## 2024-09-08 DIAGNOSIS — R591 Generalized enlarged lymph nodes: Secondary | ICD-10-CM | POA: Diagnosis not present

## 2024-09-08 DIAGNOSIS — I10 Essential (primary) hypertension: Secondary | ICD-10-CM | POA: Diagnosis not present

## 2024-09-08 DIAGNOSIS — E7800 Pure hypercholesterolemia, unspecified: Secondary | ICD-10-CM | POA: Diagnosis not present

## 2024-09-08 DIAGNOSIS — D649 Anemia, unspecified: Secondary | ICD-10-CM | POA: Diagnosis not present

## 2024-09-08 DIAGNOSIS — G4733 Obstructive sleep apnea (adult) (pediatric): Secondary | ICD-10-CM | POA: Diagnosis not present

## 2024-09-08 DIAGNOSIS — M329 Systemic lupus erythematosus, unspecified: Secondary | ICD-10-CM | POA: Diagnosis not present

## 2024-09-09 ENCOUNTER — Other Ambulatory Visit: Payer: Self-pay

## 2024-09-13 DIAGNOSIS — H4710 Unspecified papilledema: Secondary | ICD-10-CM | POA: Diagnosis not present

## 2024-09-13 DIAGNOSIS — E113291 Type 2 diabetes mellitus with mild nonproliferative diabetic retinopathy without macular edema, right eye: Secondary | ICD-10-CM | POA: Diagnosis not present

## 2024-09-13 DIAGNOSIS — B0052 Herpesviral keratitis: Secondary | ICD-10-CM | POA: Diagnosis not present

## 2024-09-13 DIAGNOSIS — H25812 Combined forms of age-related cataract, left eye: Secondary | ICD-10-CM | POA: Diagnosis not present

## 2024-09-13 DIAGNOSIS — H25811 Combined forms of age-related cataract, right eye: Secondary | ICD-10-CM | POA: Diagnosis not present

## 2024-09-17 ENCOUNTER — Other Ambulatory Visit (HOSPITAL_COMMUNITY): Payer: Self-pay

## 2024-10-07 NOTE — Progress Notes (Unsigned)
 "  Office Visit Note  Patient: Mark Nichols             Date of Birth: 1971-02-05           MRN: [id number]             PCP: Rexanne Ingle, MD Referring: Rexanne Ingle, MD Visit Date: 10/21/2024 Occupation: PHYSICIAN-HOSPITALIST  Subjective:  No chief complaint on file.   History of Present Illness: Mark Nichols is a 54 y.o. male with history of      Activities of Daily Living:  Patient reports morning stiffness for *** {minute/hour:19697}.   Patient {ACTIONS;DENIES/REPORTS:21021675::Denies} nocturnal pain.  Difficulty dressing/grooming: {ACTIONS;DENIES/REPORTS:21021675::Denies} Difficulty climbing stairs: {ACTIONS;DENIES/REPORTS:21021675::Denies} Difficulty getting out of chair: {ACTIONS;DENIES/REPORTS:21021675::Denies} Difficulty using hands for taps, buttons, cutlery, and/or writing: {ACTIONS;DENIES/REPORTS:21021675::Denies}  No Rheumatology ROS completed.   PMFS History:  Patient Active Problem List   Diagnosis Date Noted   OSA (obstructive sleep apnea) 11/26/2017   Obesity 04/18/2015   Tobacco use 12/07/2011   Chest pain 08/01/2011   HTN (hypertension) 08/01/2011   Hyperlipidemia 08/01/2011   PHARYNGITIS, STREPTOCOCCAL 01/05/2007   Acute upper respiratory infection 01/05/2007    Past Medical History:  Diagnosis Date   Diabetes (HCC)    GERD (gastroesophageal reflux disease)    Hypercholesteremia    Hypertension    Pneumonia    Sleep apnea     Family History  Problem Relation Age of Onset   Coronary artery disease Mother    Stroke Father    Healthy Daughter    Healthy Son    Healthy Son    Past Surgical History:  Procedure Laterality Date   AXILLARY LYMPH NODE BIOPSY Left 07/17/2021   Procedure: LEFT AXILLARY LYMPH NODE BIOPSY;  Surgeon: Ebbie Cough, MD;  [name];  Service: General;  Laterality: Left;   Social History[1] Social History   Social History Narrative   Not on file      There is no immunization  history on file for this patient.   Objective: Vital Signs: There were no vitals taken for this visit.   Physical Exam   Musculoskeletal Exam: ***  CDAI Exam: CDAI Score: -- Patient Global: --; Provider Global: -- Swollen: --; Tender: -- Joint Exam 10/21/2024   No joint exam has been documented for this visit   There is currently no information documented on the homunculus. Go to the Rheumatology activity and complete the homunculus joint exam.  Investigation: No additional findings.  Imaging: No results found.  Recent Labs: Lab Results  Component Value Date   WBC 3.2 (L) 04/26/2024   HGB 10.4 (L) 04/26/2024   PLT 176 04/26/2024   NA 134 (L) 04/26/2024   K 3.7 04/26/2024   CL 104 04/26/2024   CO2 24 04/26/2024   GLUCOSE 129 (H) 04/26/2024   BUN 19 04/26/2024   CREATININE 0.99 04/26/2024   BILITOT 0.2 04/26/2024   ALKPHOS 91 04/26/2024   AST 11 (L) 04/26/2024   ALT 7 04/26/2024   PROT 8.4 (H) 04/26/2024   ALBUMIN 3.4 (L) 04/26/2024   CALCIUM  8.4 (L) 04/26/2024   GFRAA >60 07/07/2018   QFTBGOLDPLUS NEGATIVE 07/05/2024    Speciality Comments: No specialty comments available.  Procedures:  No procedures performed Allergies: Chloroquine   Assessment / Plan:     Visit Diagnoses: Other systemic lupus erythematosus with other organ involvement (HCC)  High risk medication use  Pancytopenia (HCC)  Lymphadenopathy  Other proteinuria  Primary hypertension  Mixed hyperlipidemia  History of diabetes mellitus  Optic nerve edema  B12 deficiency  Iron deficiency  Tobacco use  OSA (obstructive sleep apnea)  Orders: No orders of the defined types were placed in this encounter.  No orders of the defined types were placed in this encounter.   Face-to-face time spent with patient was *** minutes. Greater than 50% of time was spent in counseling and coordination of care.  Follow-Up Instructions: No follow-ups on file.   Waddell CHRISTELLA Craze, PA-C  Note  - This record has been created using Dragon software.  Chart creation errors have been sought, but may not always  have been located. Such creation errors do not reflect on  the standard of medical care.     [1]  Social History Tobacco Use   Smoking status: Some Days    Passive exposure: Never   Smokeless tobacco: Never   Tobacco comments:    Patient smokes rarely.   Vaping Use   Vaping status: Never Used  Substance Use Topics   Alcohol use: Yes    Comment: socailly   Drug use: No   "

## 2024-10-11 ENCOUNTER — Encounter: Admitting: Internal Medicine

## 2024-10-17 ENCOUNTER — Other Ambulatory Visit: Payer: Self-pay | Admitting: Rheumatology

## 2024-10-17 ENCOUNTER — Other Ambulatory Visit (HOSPITAL_COMMUNITY): Payer: Self-pay

## 2024-10-17 DIAGNOSIS — M3219 Other organ or system involvement in systemic lupus erythematosus: Secondary | ICD-10-CM

## 2024-10-17 MED ORDER — HYDROXYCHLOROQUINE SULFATE 200 MG PO TABS
200.0000 mg | ORAL_TABLET | Freq: Two times a day (BID) | ORAL | 0 refills | Status: DC
  Filled 2024-10-17: qty 60, 30d supply, fill #0

## 2024-10-17 NOTE — Telephone Encounter (Signed)
 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 twice daily   Patient to update labs at upcoming appointment on 10/21/2024. Will remind patient about eye exam at appointment on 10/21/2024  Okay to refill Plaquenil ?

## 2024-10-21 ENCOUNTER — Other Ambulatory Visit: Payer: Self-pay

## 2024-10-21 ENCOUNTER — Ambulatory Visit: Attending: Physician Assistant | Admitting: Physician Assistant

## 2024-10-21 ENCOUNTER — Encounter: Payer: Self-pay | Admitting: Physician Assistant

## 2024-10-21 VITALS — BP 147/75 | HR 76 | Temp 98.0°F | Resp 14 | Ht 74.0 in | Wt 207.4 lb

## 2024-10-21 DIAGNOSIS — E782 Mixed hyperlipidemia: Secondary | ICD-10-CM

## 2024-10-21 DIAGNOSIS — I10 Essential (primary) hypertension: Secondary | ICD-10-CM

## 2024-10-21 DIAGNOSIS — Z79899 Other long term (current) drug therapy: Secondary | ICD-10-CM

## 2024-10-21 DIAGNOSIS — M3219 Other organ or system involvement in systemic lupus erythematosus: Secondary | ICD-10-CM

## 2024-10-21 DIAGNOSIS — R591 Generalized enlarged lymph nodes: Secondary | ICD-10-CM | POA: Diagnosis not present

## 2024-10-21 DIAGNOSIS — Z720 Tobacco use: Secondary | ICD-10-CM | POA: Diagnosis not present

## 2024-10-21 DIAGNOSIS — H4710 Unspecified papilledema: Secondary | ICD-10-CM

## 2024-10-21 DIAGNOSIS — E538 Deficiency of other specified B group vitamins: Secondary | ICD-10-CM

## 2024-10-21 DIAGNOSIS — G4733 Obstructive sleep apnea (adult) (pediatric): Secondary | ICD-10-CM

## 2024-10-21 DIAGNOSIS — E611 Iron deficiency: Secondary | ICD-10-CM

## 2024-10-21 DIAGNOSIS — D61818 Other pancytopenia: Secondary | ICD-10-CM

## 2024-10-21 DIAGNOSIS — Z8639 Personal history of other endocrine, nutritional and metabolic disease: Secondary | ICD-10-CM

## 2024-10-21 DIAGNOSIS — R808 Other proteinuria: Secondary | ICD-10-CM | POA: Diagnosis not present

## 2024-10-21 MED ORDER — HYDROXYCHLOROQUINE SULFATE 200 MG PO TABS: 200.0000 mg | ORAL_TABLET | Freq: Two times a day (BID) | ORAL | 0 refills | Status: AC

## 2024-10-21 NOTE — Telephone Encounter (Signed)
 Please review and sign no print.

## 2024-10-21 NOTE — Telephone Encounter (Signed)
-----   Message from Waddell CHRISTELLA Craze sent at 10/21/2024 11:42 AM EST ----- Please change plaquenil  prescription to 90 day supply-no print

## 2024-10-22 LAB — COMPREHENSIVE METABOLIC PANEL WITH GFR
AG Ratio: 0.9 (calc) — ABNORMAL LOW (ref 1.0–2.5)
ALT: 9 U/L (ref 9–46)
AST: 13 U/L (ref 10–35)
Albumin: 3.8 g/dL (ref 3.6–5.1)
Alkaline phosphatase (APISO): 87 U/L (ref 35–144)
BUN: 14 mg/dL (ref 7–25)
CO2: 27 mmol/L (ref 20–32)
Calcium: 8.5 mg/dL — ABNORMAL LOW (ref 8.6–10.3)
Chloride: 104 mmol/L (ref 98–110)
Creat: 0.88 mg/dL (ref 0.70–1.30)
Globulin: 4.2 g/dL — ABNORMAL HIGH (ref 1.9–3.7)
Glucose, Bld: 97 mg/dL (ref 65–99)
Potassium: 3.9 mmol/L (ref 3.5–5.3)
Sodium: 137 mmol/L (ref 135–146)
Total Bilirubin: 0.3 mg/dL (ref 0.2–1.2)
Total Protein: 8 g/dL (ref 6.1–8.1)
eGFR: 103 mL/min/{1.73_m2}

## 2024-10-22 LAB — PROTEIN / CREATININE RATIO, URINE
Creatinine, Urine: 241 mg/dL (ref 20–320)
Protein/Creat Ratio: 701 mg/g{creat} — ABNORMAL HIGH (ref 25–148)
Protein/Creatinine Ratio: 0.701 mg/mg{creat} — ABNORMAL HIGH (ref 0.025–0.148)
Total Protein, Urine: 169 mg/dL — ABNORMAL HIGH (ref 5–25)

## 2024-10-22 LAB — CBC WITH DIFFERENTIAL/PLATELET
Absolute Lymphocytes: 983 {cells}/uL (ref 850–3900)
Absolute Monocytes: 313 {cells}/uL (ref 200–950)
Basophils Absolute: 31 {cells}/uL (ref 0–200)
Basophils Relative: 1 %
Eosinophils Absolute: 62 {cells}/uL (ref 15–500)
Eosinophils Relative: 2 %
HCT: 37.3 % — ABNORMAL LOW (ref 39.4–51.1)
Hemoglobin: 11.1 g/dL — ABNORMAL LOW (ref 13.2–17.1)
MCH: 23.3 pg — ABNORMAL LOW (ref 27.0–33.0)
MCHC: 29.8 g/dL — ABNORMAL LOW (ref 31.6–35.4)
MCV: 78.2 fL — ABNORMAL LOW (ref 81.4–101.7)
MPV: 10.6 fL (ref 7.5–12.5)
Monocytes Relative: 10.1 %
Neutro Abs: 1711 {cells}/uL (ref 1500–7800)
Neutrophils Relative %: 55.2 %
Platelets: 168 10*3/uL (ref 140–400)
RBC: 4.77 Million/uL (ref 4.20–5.80)
RDW: 18 % — ABNORMAL HIGH (ref 11.0–15.0)
Total Lymphocyte: 31.7 %
WBC: 3.1 10*3/uL — ABNORMAL LOW (ref 3.8–10.8)

## 2024-10-22 LAB — C3 AND C4
C3 Complement: 68 mg/dL — ABNORMAL LOW (ref 82–185)
C4 Complement: 17 mg/dL (ref 15–53)

## 2024-10-22 LAB — ANTI-DNA ANTIBODY, DOUBLE-STRANDED: ds DNA Ab: 1 [IU]/mL

## 2024-10-22 LAB — SEDIMENTATION RATE: Sed Rate: 17 mm/h (ref 0–20)

## 2024-10-25 ENCOUNTER — Ambulatory Visit: Payer: Self-pay | Admitting: Physician Assistant

## 2024-10-25 NOTE — Progress Notes (Signed)
 Please review results--ESR has returned to [facility].  C3 has dropped slightly but dsDNA remains negative.   Urine protein creatinine ratio remains elevated-plan to forward to Dr. Tobie.   Anemia improved.  WBC count remains low but stable.

## 2024-10-25 NOTE — Progress Notes (Signed)
 Please forward results to nephrology and his PCP.

## 2024-10-31 ENCOUNTER — Other Ambulatory Visit (HOSPITAL_COMMUNITY): Payer: Self-pay

## 2024-10-31 ENCOUNTER — Encounter (HOSPITAL_COMMUNITY): Payer: Self-pay

## 2024-10-31 MED ORDER — INSULIN LISPRO (1 UNIT DIAL) 100 UNIT/ML (KWIKPEN)
4.0000 [IU] | PEN_INJECTOR | Freq: Every day | SUBCUTANEOUS | 4 refills | Status: AC
  Filled 2024-10-31: qty 9, 84d supply, fill #0

## 2024-10-31 MED ORDER — EMBECTA PEN NEEDLE NANO 32G X 4 MM MISC
Freq: Every day | 4 refills | Status: AC
  Filled 2024-10-31: qty 100, 90d supply, fill #0

## 2024-11-04 ENCOUNTER — Encounter: Admitting: Dietician

## 2024-12-01 ENCOUNTER — Encounter: Admitting: Dietician

## 2025-03-24 ENCOUNTER — Ambulatory Visit: Admitting: Rheumatology
